# Patient Record
Sex: Male | Born: 1940 | Race: White | Hispanic: No | Smoking: Never smoker
Health system: Southern US, Community
[De-identification: ages and names within clinical notes are randomized; demographics above are authoritative.]

## PROBLEM LIST (undated history)

## (undated) DIAGNOSIS — I10 Essential (primary) hypertension: Secondary | ICD-10-CM

## (undated) DIAGNOSIS — E119 Type 2 diabetes mellitus without complications: Secondary | ICD-10-CM

## (undated) HISTORY — PX: NOSE SURGERY: SHX723

## (undated) HISTORY — PX: HERNIA REPAIR: SHX51

---

## 2009-03-30 ENCOUNTER — Inpatient Hospital Stay (HOSPITAL_COMMUNITY): Admission: RE | Admit: 2009-03-30 | Discharge: 2009-04-11 | Payer: Self-pay | Admitting: Psychiatry

## 2009-03-30 ENCOUNTER — Ambulatory Visit: Payer: Self-pay | Admitting: Psychiatry

## 2009-04-29 ENCOUNTER — Emergency Department (HOSPITAL_COMMUNITY): Admission: EM | Admit: 2009-04-29 | Discharge: 2009-05-01 | Payer: Self-pay | Admitting: Emergency Medicine

## 2009-04-29 ENCOUNTER — Ambulatory Visit (HOSPITAL_COMMUNITY): Admission: RE | Admit: 2009-04-29 | Discharge: 2009-04-29 | Payer: Self-pay | Admitting: Psychiatry

## 2010-08-11 IMAGING — CR DG CHEST 2V
2 series · 2 of 2 positions shown · non-contrast
Comparison: None

CLINICAL DATA: Hypertension, diabetic.  Medical clearance.

CHEST - 2 VIEW

[w chest pa]
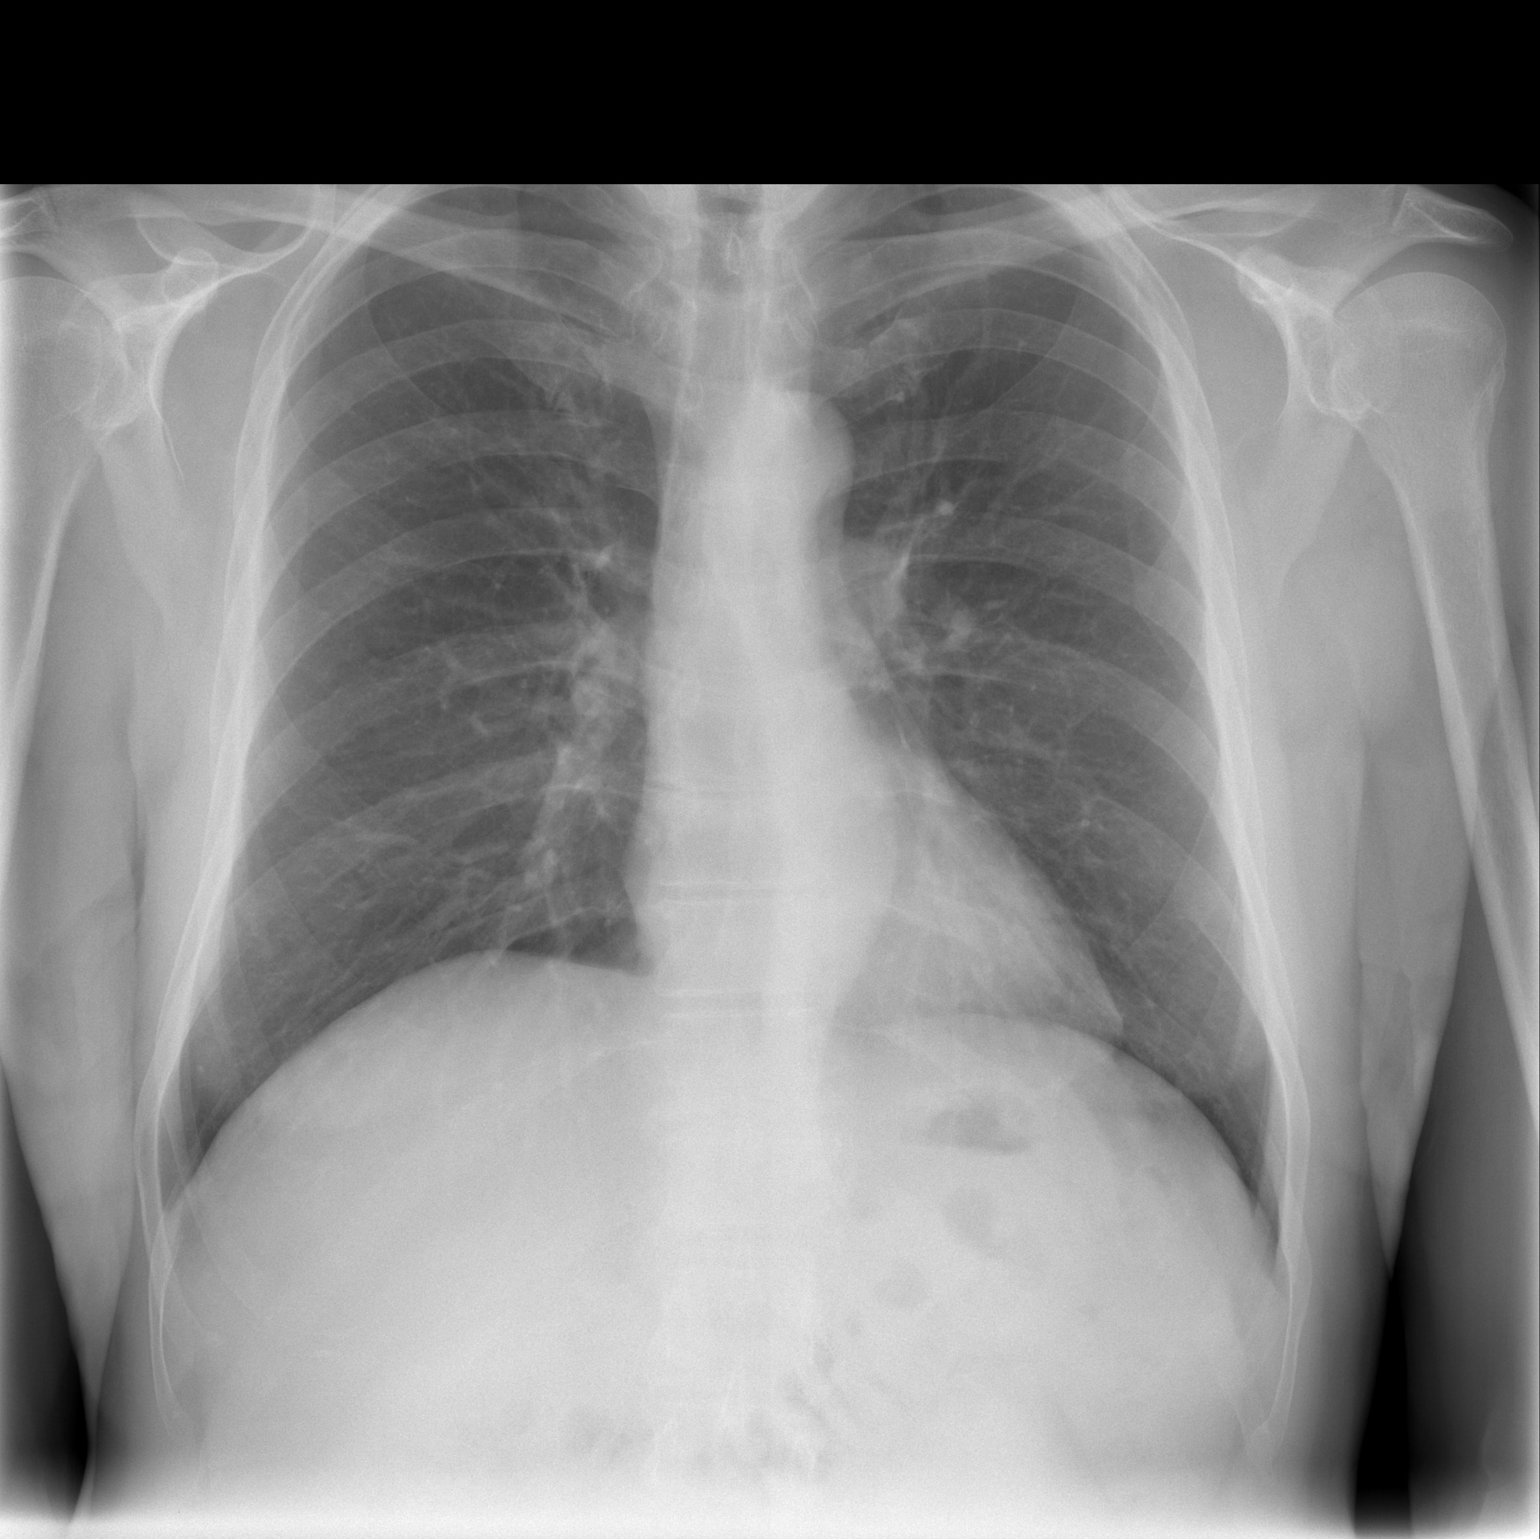

[w chest lat]
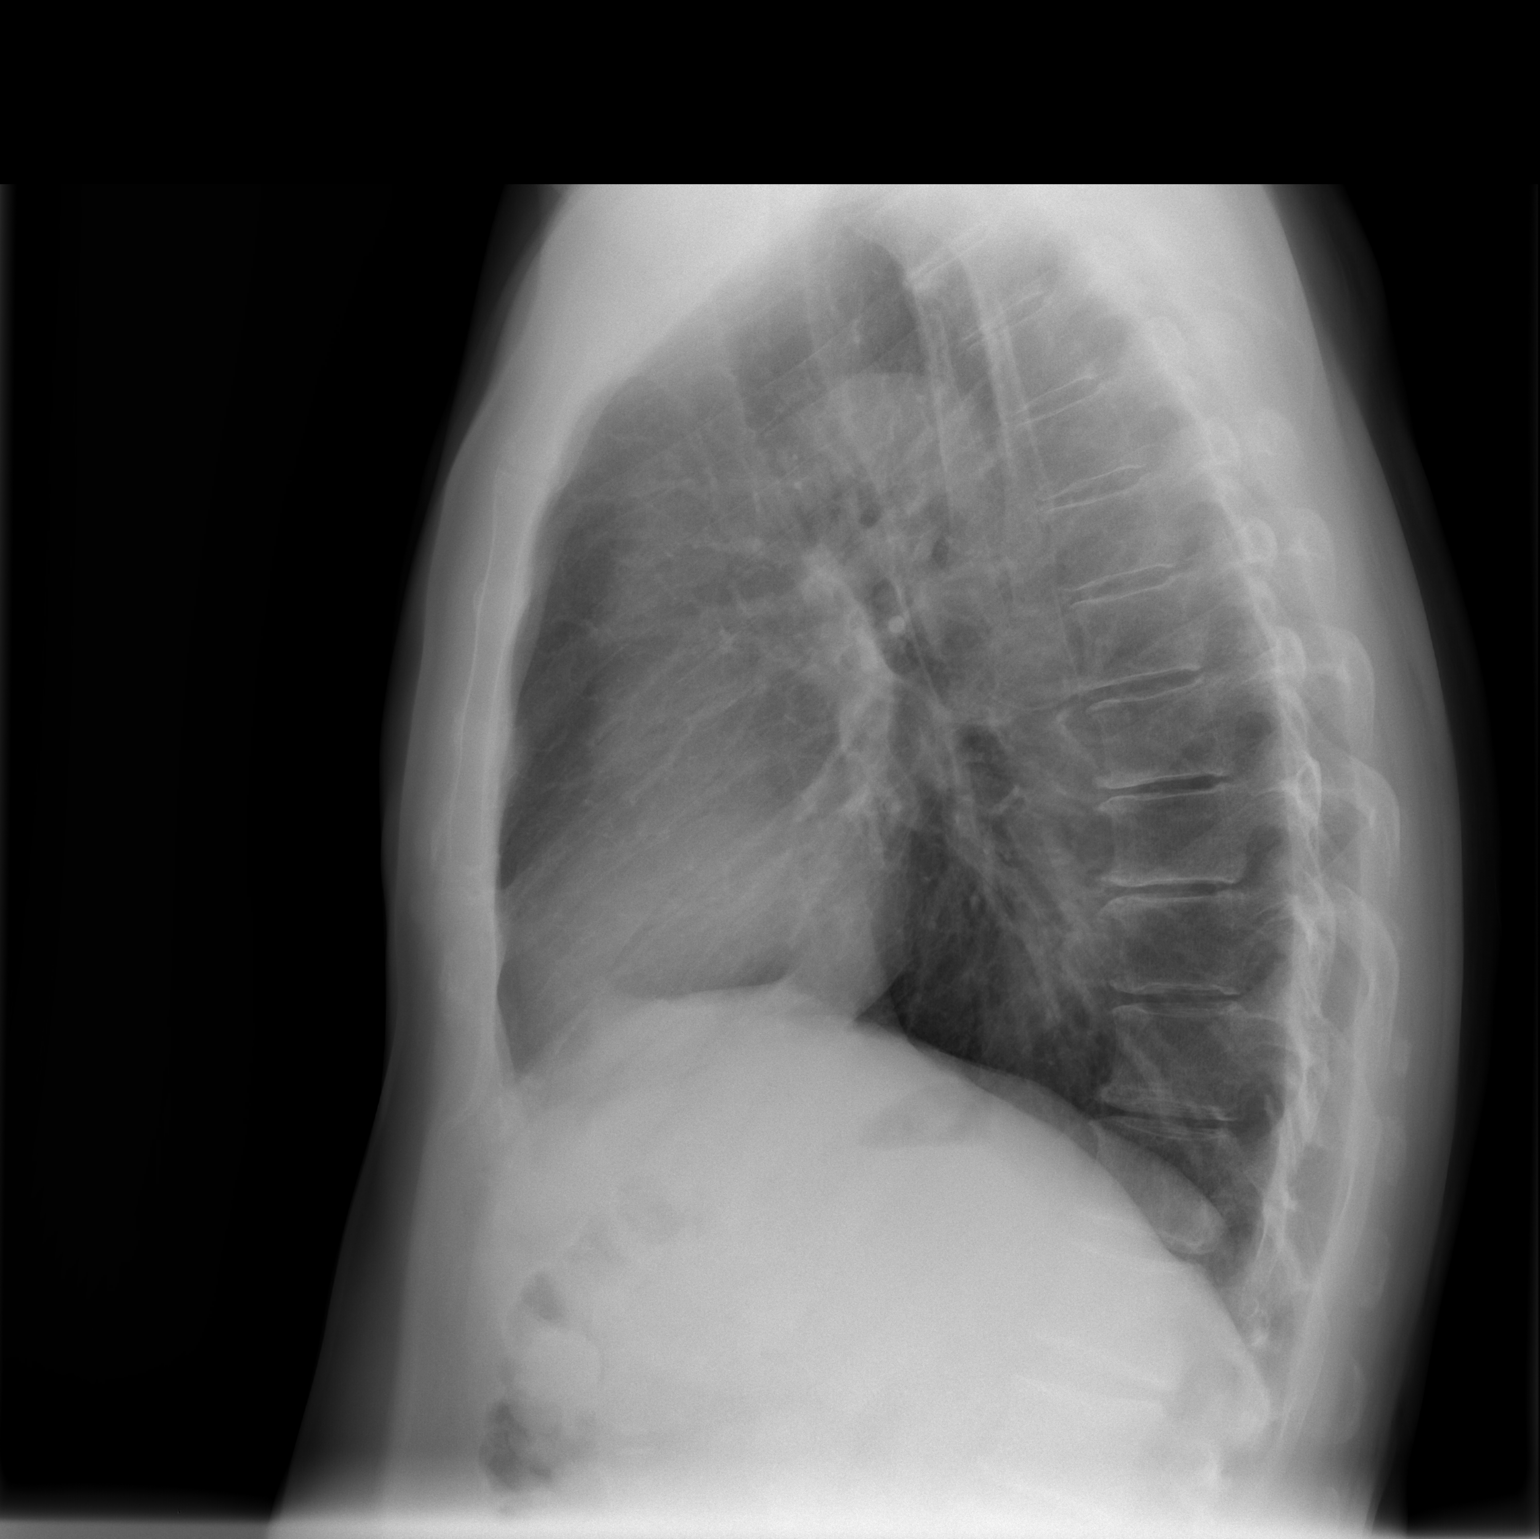

[2 of 2 positions shown; findings below may reference images not displayed]

FINDINGS: The cardiomediastinal silhouette is unremarkable.
The lungs are clear.
There is no evidence of focal airspace disease, pulmonary edema,
pleural effusion, or pneumothorax.
No acute bony abnormalities are identified.
IMPRESSION: No evidence of acute cardiopulmonary disease.

## 2010-08-20 LAB — URINALYSIS, ROUTINE W REFLEX MICROSCOPIC
Glucose, UA: NEGATIVE mg/dL
Ketones, ur: NEGATIVE mg/dL
pH: 5.5 (ref 5.0–8.0)

## 2010-08-20 LAB — BASIC METABOLIC PANEL
BUN: 11 mg/dL (ref 6–23)
CO2: 27 mEq/L (ref 19–32)
Chloride: 102 mEq/L (ref 96–112)
Glucose, Bld: 100 mg/dL — ABNORMAL HIGH (ref 70–99)
Potassium: 4.2 mEq/L (ref 3.5–5.1)

## 2010-08-20 LAB — RAPID URINE DRUG SCREEN, HOSP PERFORMED
Cocaine: NOT DETECTED
Opiates: NOT DETECTED

## 2010-08-20 LAB — CBC
HCT: 47.2 % (ref 39.0–52.0)
Hemoglobin: 15.9 g/dL (ref 13.0–17.0)
MCHC: 33.7 g/dL (ref 30.0–36.0)
MCV: 92.4 fL (ref 78.0–100.0)
RDW: 12.9 % (ref 11.5–15.5)

## 2010-08-20 LAB — DIFFERENTIAL
Basophils Absolute: 0 10*3/uL (ref 0.0–0.1)
Eosinophils Relative: 1 % (ref 0–5)
Lymphocytes Relative: 25 % (ref 12–46)
Monocytes Absolute: 0.4 10*3/uL (ref 0.1–1.0)

## 2010-08-20 LAB — HEPATIC FUNCTION PANEL
ALT: 22 U/L (ref 0–53)
AST: 18 U/L (ref 0–37)
Bilirubin, Direct: 0.1 mg/dL (ref 0.0–0.3)
Total Protein: 6.9 g/dL (ref 6.0–8.3)

## 2010-08-21 LAB — GLUCOSE, CAPILLARY
Glucose-Capillary: 109 mg/dL — ABNORMAL HIGH (ref 70–99)
Glucose-Capillary: 114 mg/dL — ABNORMAL HIGH (ref 70–99)
Glucose-Capillary: 135 mg/dL — ABNORMAL HIGH (ref 70–99)
Glucose-Capillary: 135 mg/dL — ABNORMAL HIGH (ref 70–99)
Glucose-Capillary: 166 mg/dL — ABNORMAL HIGH (ref 70–99)
Glucose-Capillary: 202 mg/dL — ABNORMAL HIGH (ref 70–99)
Glucose-Capillary: 242 mg/dL — ABNORMAL HIGH (ref 70–99)
Glucose-Capillary: 260 mg/dL — ABNORMAL HIGH (ref 70–99)
Glucose-Capillary: 261 mg/dL — ABNORMAL HIGH (ref 70–99)
Glucose-Capillary: 134 mg/dL — ABNORMAL HIGH (ref 70–99)
Glucose-Capillary: 148 mg/dL — ABNORMAL HIGH (ref 70–99)
Glucose-Capillary: 188 mg/dL — ABNORMAL HIGH (ref 70–99)
Glucose-Capillary: 88 mg/dL (ref 70–99)
Glucose-Capillary: 99 mg/dL (ref 70–99)

## 2010-08-21 LAB — CBC
HCT: 45.1 % (ref 39.0–52.0)
Hemoglobin: 15.5 g/dL (ref 13.0–17.0)
MCV: 91.3 fL (ref 78.0–100.0)
Platelets: 244 10*3/uL (ref 150–400)
WBC: 4.6 10*3/uL (ref 4.0–10.5)

## 2010-08-21 LAB — COMPREHENSIVE METABOLIC PANEL
Albumin: 4 g/dL (ref 3.5–5.2)
Alkaline Phosphatase: 50 U/L (ref 39–117)
BUN: 13 mg/dL (ref 6–23)
CO2: 28 mEq/L (ref 19–32)
Chloride: 99 mEq/L (ref 96–112)
Creatinine, Ser: 1 mg/dL (ref 0.4–1.5)
GFR calc non Af Amer: >60 mL/min (ref >60)
Glucose, Bld: 194 mg/dL — ABNORMAL HIGH (ref 70–99)
Potassium: 3.7 mEq/L (ref 3.5–5.1)
Total Bilirubin: 1 mg/dL (ref 0.3–1.2)

## 2015-05-12 ENCOUNTER — Encounter (HOSPITAL_BASED_OUTPATIENT_CLINIC_OR_DEPARTMENT_OTHER): Payer: Self-pay | Admitting: Emergency Medicine

## 2015-05-12 ENCOUNTER — Emergency Department (HOSPITAL_BASED_OUTPATIENT_CLINIC_OR_DEPARTMENT_OTHER)
Admission: EM | Admit: 2015-05-12 | Discharge: 2015-05-13 | Disposition: A | Discharge status: Home / Self Care | Payer: Medicare HMO | Attending: Emergency Medicine | Admitting: Emergency Medicine

## 2015-05-12 DIAGNOSIS — I10 Essential (primary) hypertension: Secondary | ICD-10-CM | POA: Insufficient documentation

## 2015-05-12 DIAGNOSIS — R234 Changes in skin texture: Secondary | ICD-10-CM

## 2015-05-12 DIAGNOSIS — L988 Other specified disorders of the skin and subcutaneous tissue: Secondary | ICD-10-CM | POA: Insufficient documentation

## 2015-05-12 DIAGNOSIS — E119 Type 2 diabetes mellitus without complications: Secondary | ICD-10-CM | POA: Insufficient documentation

## 2015-05-12 DIAGNOSIS — Z7984 Long term (current) use of oral hypoglycemic drugs: Secondary | ICD-10-CM | POA: Insufficient documentation

## 2015-05-12 DIAGNOSIS — Z79899 Other long term (current) drug therapy: Secondary | ICD-10-CM | POA: Diagnosis not present

## 2015-05-12 HISTORY — DX: Essential (primary) hypertension: I10

## 2015-05-12 HISTORY — DX: Type 2 diabetes mellitus without complications: E11.9

## 2015-05-12 NOTE — ED Provider Notes (Signed)
CSN: [id number]     Arrival date & time 05/12/15  2134 History  By signing my name below, I, Elon SpannerGarrett Cook, attest that this documentation has been prepared under the direction and in the presence of Shon Batonourtney F Maronda Caison, MD. Electronically Signed: Elon SpannerGarrett Cook, ED Scribe. 05/12/2015. 11:52 PM.   Chief Complaint  Patient presents with  . Hand Problem    The history is provided by the patient. No language interpreter was used.   HPI Comments: Catha GosselinDarrell Corbitt is a 74 y.o. right-handed male who presents to the Emergency Department complaining of an improving, non-draining wound on knuckles of the left hand onset 4 days ago that began as dry skin.  The patient reports concern of infection prompted him to come to the ED today.  He reports mild stinging of the wound at night.  He notes some grey tinge to the surrounding skin by has not noticed any redness.   Denies drainage or fever.  Past Medical History  Diagnosis Date  . Hypertension   . Diabetes mellitus without complication [facility](HCC)    Past Surgical History  Procedure Laterality Date  . Hernia repair    . Nose surgery     History reviewed. No pertinent family history. Social History  Substance Use Topics  . Smoking status: Never Smoker   . Smokeless tobacco: None  . Alcohol Use: No    Review of Systems  Constitutional: Negative for fever.  Skin: Positive for wound. Negative for color change.  All other systems reviewed and are negative.  A complete 10 system review of systems was obtained and all systems are negative except as noted in the HPI and PMH.   Allergies  Review of patient's allergies indicates no known allergies.  Home Medications   Prior to Admission medications   Medication Sig Start Date End Date Taking? Authorizing Provider  glipiZIDE (GLUCOTROL) 10 MG tablet Take 10 mg by mouth daily before breakfast.   Yes Historical Provider, MD  lisinopril (PRINIVIL,ZESTRIL) 10 MG tablet Take 10 mg by mouth daily.   Yes  Historical Provider, MD  cephALEXin (KEFLEX) 500 MG capsule Take 1 capsule (500 mg total) by mouth 4 (four) times daily. 05/13/15   Shon Batonourtney F Nima Bamburg, MD   BP 108/77 mmHg  Pulse 94  Temp(Src) 98.8 F (37.1 C) (Oral)  Resp 18  Ht 5\' 9"  (1.753 m)  Wt 139 lb 7 oz (63.248 kg)  BMI 20.58 kg/m2  SpO2 98% Physical Exam  Constitutional: He is oriented to person, place, and time. No distress.  Elderly  HENT:  Head: Normocephalic and atraumatic.  Cardiovascular: Normal rate and regular rhythm.   Pulmonary/Chest: Effort normal. No respiratory distress.  Musculoskeletal: He exhibits no edema or tenderness.  Normal range of motion of the MCP, PIP and DIP joints of the left hand, 2+ radial pulse  Neurological: He is alert and oriented to person, place, and time.  Skin: Skin is warm and dry.  There is a 1.5 cm crack over the third MCP joint the left hand, mild erythema just adjacent to the skin break, no significant erythema or streaking up the arm  Psychiatric: He has a normal mood and affect.  Nursing note and vitals reviewed.   ED Course  Procedures (including critical care time)  DIAGNOSTIC STUDIES: Oxygen Saturation is 99% on RA, normal by my interpretation.    COORDINATION OF CARE:  11:52 PM Patient advised to use moisturizer.  Discussed low concern for infection.  Return precautions advised.  Patient  acknowledges and agrees with plan.    Labs Review Labs Reviewed - No data to display  Imaging Review No results found. I have personally reviewed and evaluated these images and lab results as part of my medical decision-making.   EKG Interpretation None      MDM   Final diagnoses:  Cracked skin    Patient presents with cracked dry skin over the knuckles of the left hand with a significant break in the skin over the left third MCP joint. No obvious cellulitis; however, patient is certainly at risk for cellulitis. He is neurovascularly intact. Discussed with patient using  Aquaphor or another moisturizing ointment on his hands. Topical antibiotic ointment to the wound. We'll provide the patient with a short course of Keflex given that he is at risk for cellulitis and has mild erythema adjacent to the wound.  After history, exam, and medical workup I feel the patient has been appropriately medically screened and is safe for discharge home. Pertinent diagnoses were discussed with the patient. Patient was given return precautions.  I personally performed the services described in this documentation, which was scribed in my presence. The recorded information has been reviewed and is accurate.    Shon Baton, MD 05/13/15 [id number]

## 2015-05-12 NOTE — ED Notes (Signed)
Patient states that he has a dry spot open and crack on his hand about 4 days ago. The patient has an open area to his 2nd knuckle with swelling noted. Patient reports that it is warm to touch and red, this RN does not appreciate these finding.

## 2015-05-13 MED ORDER — CEPHALEXIN 500 MG PO CAPS: 500.0000 mg | ORAL_CAPSULE | Freq: Four times a day (QID) | ORAL | Status: DC

## 2015-05-13 NOTE — Discharge Instructions (Signed)
You were seen today for a crack in your skin.  THis puts you at risk for cellulitis.  Some mild redness around scratch, he will be given several days of antibiotics. See return precautions for cellulitis below.  Cellulitis Cellulitis is an infection of the skin and the tissue beneath it. The infected area is usually red and tender. Cellulitis occurs most often in the arms and lower legs.  CAUSES  Cellulitis is caused by bacteria that enter the skin through cracks or cuts in the skin. The most common types of bacteria that cause cellulitis are staphylococci and streptococci. SIGNS AND SYMPTOMS   Redness and warmth.  Swelling.  Tenderness or pain.  Fever. DIAGNOSIS  Your health care provider can usually determine what is wrong based on a physical exam. Blood tests may also be done. TREATMENT  Treatment usually involves taking an antibiotic medicine. HOME CARE INSTRUCTIONS   Take your antibiotic medicine as directed by your health care provider. Finish the antibiotic even if you start to feel better.  Keep the infected arm or leg elevated to reduce swelling.  Apply a warm cloth to the affected area up to 4 times per day to relieve pain.  Take medicines only as directed by your health care provider.  Keep all follow-up visits as directed by your health care provider. SEEK MEDICAL CARE IF:   You notice red streaks coming from the infected area.  Your red area gets larger or turns dark in color.  Your bone or joint underneath the infected area becomes painful after the skin has healed.  Your infection returns in the same area or another area.  You notice a swollen bump in the infected area.  You develop new symptoms.  You have a fever. SEEK IMMEDIATE MEDICAL CARE IF:   You feel very sleepy.  You develop vomiting or diarrhea.  You have a general ill feeling (malaise) with muscle aches and pains.   This information is not intended to replace advice given to you by your  health care provider. Make sure you discuss any questions you have with your health care provider.   Document Released: 02/12/2005 Document Revised: 01/24/2015 Document Reviewed: 07/21/2011 Elsevier Interactive Patient Education [employer]2016 [employer]vier Inc.

## 2015-09-18 ENCOUNTER — Emergency Department (HOSPITAL_COMMUNITY): Payer: Medicare HMO

## 2015-09-18 ENCOUNTER — Inpatient Hospital Stay (HOSPITAL_COMMUNITY): Payer: Medicare HMO

## 2015-09-18 ENCOUNTER — Encounter (HOSPITAL_COMMUNITY): Payer: Self-pay | Admitting: *Deleted

## 2015-09-18 ENCOUNTER — Inpatient Hospital Stay (HOSPITAL_COMMUNITY)
Admission: EM | Admit: 2015-09-18 | Discharge: 2015-09-19 | DRG: 065 | Disposition: A | Discharge status: Home Health | Payer: Medicare HMO | Attending: Internal Medicine | Admitting: Internal Medicine

## 2015-09-18 DIAGNOSIS — E119 Type 2 diabetes mellitus without complications: Secondary | ICD-10-CM | POA: Diagnosis not present

## 2015-09-18 DIAGNOSIS — Z823 Family history of stroke: Secondary | ICD-10-CM | POA: Diagnosis not present

## 2015-09-18 DIAGNOSIS — R270 Ataxia, unspecified: Secondary | ICD-10-CM | POA: Diagnosis present

## 2015-09-18 DIAGNOSIS — I10 Essential (primary) hypertension: Secondary | ICD-10-CM | POA: Diagnosis present

## 2015-09-18 DIAGNOSIS — E785 Hyperlipidemia, unspecified: Secondary | ICD-10-CM | POA: Diagnosis present

## 2015-09-18 DIAGNOSIS — Z7902 Long term (current) use of antithrombotics/antiplatelets: Secondary | ICD-10-CM | POA: Diagnosis not present

## 2015-09-18 DIAGNOSIS — R200 Anesthesia of skin: Secondary | ICD-10-CM

## 2015-09-18 DIAGNOSIS — I6789 Other cerebrovascular disease: Secondary | ICD-10-CM | POA: Diagnosis present

## 2015-09-18 DIAGNOSIS — Z7984 Long term (current) use of oral hypoglycemic drugs: Secondary | ICD-10-CM

## 2015-09-18 DIAGNOSIS — I639 Cerebral infarction, unspecified: Secondary | ICD-10-CM

## 2015-09-18 DIAGNOSIS — I672 Cerebral atherosclerosis: Secondary | ICD-10-CM | POA: Diagnosis present

## 2015-09-18 DIAGNOSIS — I161 Hypertensive emergency: Secondary | ICD-10-CM | POA: Diagnosis present

## 2015-09-18 DIAGNOSIS — R29701 NIHSS score 1: Secondary | ICD-10-CM | POA: Diagnosis present

## 2015-09-18 DIAGNOSIS — I638 Other cerebral infarction: Secondary | ICD-10-CM | POA: Diagnosis not present

## 2015-09-18 DIAGNOSIS — Z7982 Long term (current) use of aspirin: Secondary | ICD-10-CM | POA: Diagnosis not present

## 2015-09-18 DIAGNOSIS — R000 Tachycardia, unspecified: Secondary | ICD-10-CM | POA: Diagnosis present

## 2015-09-18 DIAGNOSIS — R202 Paresthesia of skin: Secondary | ICD-10-CM | POA: Diagnosis present

## 2015-09-18 DIAGNOSIS — E1165 Type 2 diabetes mellitus with hyperglycemia: Secondary | ICD-10-CM | POA: Diagnosis present

## 2015-09-18 LAB — DIFFERENTIAL
Basophils Absolute: 0 10*3/uL (ref 0.0–0.1)
Basophils Relative: 0 %
Eosinophils Absolute: 0.1 10*3/uL (ref 0.0–0.7)
Eosinophils Relative: 2 %
LYMPHS PCT: 19 %
Lymphs Abs: 1.2 10*3/uL (ref 0.7–4.0)
MONO ABS: 0.6 10*3/uL (ref 0.1–1.0)
Monocytes Relative: 9 %
Neutro Abs: 4.5 10*3/uL (ref 1.7–7.7)
Neutrophils Relative %: 70 %

## 2015-09-18 LAB — URINALYSIS, ROUTINE W REFLEX MICROSCOPIC
Bilirubin Urine: NEGATIVE
Glucose, UA: NEGATIVE mg/dL
Hgb urine dipstick: NEGATIVE
Ketones, ur: NEGATIVE mg/dL
LEUKOCYTES UA: NEGATIVE
NITRITE: NEGATIVE
PH: 6.5 (ref 5.0–8.0)
Protein, ur: 100 mg/dL — AB
SPECIFIC GRAVITY, URINE: 1.008 (ref 1.005–1.030)

## 2015-09-18 LAB — COMPREHENSIVE METABOLIC PANEL
ALK PHOS: 62 U/L (ref 38–126)
ALT: 15 U/L — ABNORMAL LOW (ref 17–63)
ANION GAP: 11 (ref 5–15)
AST: 18 U/L (ref 15–41)
Albumin: 3.9 g/dL (ref 3.5–5.0)
BUN: 15 mg/dL (ref 6–20)
CALCIUM: 9.6 mg/dL (ref 8.9–10.3)
CO2: 25 mmol/L (ref 22–32)
Chloride: 102 mmol/L (ref 101–111)
Creatinine, Ser: 1.06 mg/dL (ref 0.61–1.24)
GFR calc non Af Amer: >60 mL/min (ref >60)
Glucose, Bld: 174 mg/dL — ABNORMAL HIGH (ref 65–99)
Potassium: 3.9 mmol/L (ref 3.5–5.1)
SODIUM: 138 mmol/L (ref 135–145)
TOTAL PROTEIN: 6.7 g/dL (ref 6.5–8.1)
Total Bilirubin: 0.5 mg/dL (ref 0.3–1.2)

## 2015-09-18 LAB — I-STAT CHEM 8, ED
BUN: 17 mg/dL (ref 6–20)
CALCIUM ION: 1.09 mmol/L — AB (ref 1.13–1.30)
CREATININE: 1 mg/dL (ref 0.61–1.24)
Chloride: 100 mmol/L — ABNORMAL LOW (ref 101–111)
GLUCOSE: 175 mg/dL — AB (ref 65–99)
HEMATOCRIT: 43 % (ref 39.0–52.0)
HEMOGLOBIN: 14.6 g/dL (ref 13.0–17.0)
Potassium: 3.8 mmol/L (ref 3.5–5.1)
Sodium: 139 mmol/L (ref 135–145)
TCO2: 25 mmol/L (ref 0–100)

## 2015-09-18 LAB — CBC
HCT: 41.1 % (ref 39.0–52.0)
Hemoglobin: 13.8 g/dL (ref 13.0–17.0)
MCH: 30.1 pg (ref 26.0–34.0)
MCHC: 33.6 g/dL (ref 30.0–36.0)
MCV: 89.5 fL (ref 78.0–100.0)
PLATELETS: 247 10*3/uL (ref 150–400)
RBC: 4.59 MIL/uL (ref 4.22–5.81)
RDW: 13 % (ref 11.5–15.5)
WBC: 6.5 10*3/uL (ref 4.0–10.5)

## 2015-09-18 LAB — ECHOCARDIOGRAM COMPLETE
HEIGHTINCHES: 69 in
WEIGHTICAEL: 2522.06 [oz_av]

## 2015-09-18 LAB — URINE MICROSCOPIC-ADD ON: Squamous Epithelial / HPF: NONE SEEN

## 2015-09-18 LAB — LIPID PANEL
CHOLESTEROL: 222 mg/dL — AB (ref 0–200)
HDL: 36 mg/dL — ABNORMAL LOW (ref >40)
LDL Cholesterol: 130 mg/dL — ABNORMAL HIGH (ref 0–99)
Total CHOL/HDL Ratio: 6.2 RATIO
Triglycerides: 279 mg/dL — ABNORMAL HIGH (ref <150)
VLDL: 56 mg/dL — ABNORMAL HIGH (ref 0–40)

## 2015-09-18 LAB — CBG MONITORING, ED: Glucose-Capillary: 163 mg/dL — ABNORMAL HIGH (ref 65–99)

## 2015-09-18 LAB — APTT: aPTT: 30 seconds (ref 24–37)

## 2015-09-18 LAB — GLUCOSE, CAPILLARY
GLUCOSE-CAPILLARY: 137 mg/dL — AB (ref 65–99)
GLUCOSE-CAPILLARY: 139 mg/dL — AB (ref 65–99)
Glucose-Capillary: 164 mg/dL — ABNORMAL HIGH (ref 65–99)
Glucose-Capillary: 193 mg/dL — ABNORMAL HIGH (ref 65–99)

## 2015-09-18 LAB — PROTIME-INR
INR: 1.07 (ref 0.00–1.49)
PROTHROMBIN TIME: 14.1 s (ref 11.6–15.2)

## 2015-09-18 LAB — RAPID URINE DRUG SCREEN, HOSP PERFORMED
AMPHETAMINES: NOT DETECTED
Barbiturates: NOT DETECTED
Benzodiazepines: NOT DETECTED
Cocaine: NOT DETECTED
OPIATES: NOT DETECTED
Tetrahydrocannabinol: NOT DETECTED

## 2015-09-18 LAB — I-STAT TROPONIN, ED: Troponin i, poc: 0 ng/mL (ref 0.00–0.08)

## 2015-09-18 LAB — ETHANOL

## 2015-09-18 MED ORDER — CLOPIDOGREL BISULFATE 75 MG PO TABS
75.0000 mg | ORAL_TABLET | Freq: Every day | ORAL | Status: DC
  Administered 2015-09-18 – 2015-09-19 (×2): 75 mg via ORAL
  Filled 2015-09-18 (×2): qty 1

## 2015-09-18 MED ORDER — ASPIRIN 325 MG PO TABS
325.0000 mg | ORAL_TABLET | Freq: Every day | ORAL | Status: DC
  Administered 2015-09-18: 325 mg via ORAL
  Filled 2015-09-18: qty 1

## 2015-09-18 MED ORDER — ACETAMINOPHEN 500 MG PO TABS
1000.0000 mg | ORAL_TABLET | Freq: Four times a day (QID) | ORAL | Status: DC | PRN
  Administered 2015-09-18 – 2015-09-19 (×4): 1000 mg via ORAL
  Filled 2015-09-18 (×4): qty 2

## 2015-09-18 MED ORDER — INSULIN ASPART 100 UNIT/ML ~~LOC~~ SOLN: 0.0000 [IU] | SUBCUTANEOUS | Status: DC

## 2015-09-18 MED ORDER — STROKE: EARLY STAGES OF RECOVERY BOOK
Freq: Once | Status: AC
  Administered 2015-09-18: 04:00:00
  Filled 2015-09-18: qty 1

## 2015-09-18 MED ORDER — INSULIN ASPART 100 UNIT/ML ~~LOC~~ SOLN
0.0000 [IU] | Freq: Three times a day (TID) | SUBCUTANEOUS | Status: DC
  Administered 2015-09-18: 2 [IU] via SUBCUTANEOUS
  Administered 2015-09-18 – 2015-09-19 (×3): 1 [IU] via SUBCUTANEOUS
  Administered 2015-09-19 (×2): 2 [IU] via SUBCUTANEOUS

## 2015-09-18 MED ORDER — ENOXAPARIN SODIUM 40 MG/0.4ML ~~LOC~~ SOLN
40.0000 mg | Freq: Every day | SUBCUTANEOUS | Status: DC
  Administered 2015-09-18 – 2015-09-19 (×2): 40 mg via SUBCUTANEOUS
  Filled 2015-09-18 (×2): qty 0.4

## 2015-09-18 MED ORDER — ATORVASTATIN CALCIUM 40 MG PO TABS
40.0000 mg | ORAL_TABLET | Freq: Every day | ORAL | Status: DC
  Administered 2015-09-18 – 2015-09-19 (×2): 40 mg via ORAL
  Filled 2015-09-18 (×2): qty 1

## 2015-09-18 MED ORDER — SENNOSIDES-DOCUSATE SODIUM 8.6-50 MG PO TABS: 1.0000 | ORAL_TABLET | Freq: Every evening | ORAL | Status: DC | PRN

## 2015-09-18 MED ORDER — PANTOPRAZOLE SODIUM 40 MG PO TBEC
80.0000 mg | DELAYED_RELEASE_TABLET | Freq: Every day | ORAL | Status: DC
  Administered 2015-09-18 – 2015-09-19 (×2): 80 mg via ORAL
  Filled 2015-09-18 (×2): qty 2

## 2015-09-18 NOTE — H&P (Signed)
History and Physical    [name] UJW:[id number] DOB: 04-19-1941 DOA: 09/18/2015  Referring MD/NP/PA: Dr. Clarene Duke PCP: No primary care provider on file. Outpatient Specialists: None Patient coming from: Home  Chief Complaint: R sided weakness  HPI: Kenneth Arnold is a 75 y.o. male with medical history significant of DM2, HTN.  Patient presents to the ED with c/o sudden onset of R sided numbness and weakness.  Symptoms onset earlier this evening at about 2345.  Symptoms have been improving since onset and are nearly resolved at this point but not completely back to baseline.  ED Course: Patient initially seen as a code stroke in ED.  TPA offered but not recommended due to rapidly improving deficits which are nearly resolved by the time I am seeing him in the ED.  Review of Systems: As per HPI otherwise 10 point review of systems negative.    Past Medical History  Diagnosis Date  . Hypertension   . Diabetes mellitus without complication [facility])     Past Surgical History  Procedure Laterality Date  . Hernia repair    . Nose surgery       reports that he has never smoked. He does not have any smokeless tobacco history on file. He reports that he does not drink alcohol or use illicit drugs.  No Known Allergies  No family history on file.   Prior to Admission medications   Medication Sig Start Date End Date Taking? Authorizing Provider  acetaminophen (TYLENOL) 500 MG tablet Take 1,000 mg by mouth every 6 (six) hours as needed for headache.   Yes Historical Provider, MD  aspirin EC 81 MG tablet Take 81 mg by mouth daily.   Yes Historical Provider, MD  esomeprazole (NEXIUM) 40 MG capsule Take 40 mg by mouth daily at 12 noon.   Yes Historical Provider, MD  glimepiride (AMARYL) 4 MG tablet Take 4 mg by mouth daily with breakfast.   Yes Historical Provider, MD  lisinopril (PRINIVIL,ZESTRIL) 10 MG tablet Take 10 mg by mouth daily.   Yes Historical Provider, MD    Physical  Exam: Filed Vitals:   09/18/15 0230 09/18/15 0245 09/18/15 0300 09/18/15 0315  BP: 140/105 152/106 163/111 159/105  Pulse: 98 100 101 92  Temp:      TempSrc:      Resp: 18 33 19 12  Height:      Weight:      SpO2: 94% 95% 95% 95%      Constitutional: NAD, calm, comfortable Filed Vitals:   09/18/15 0230 09/18/15 0245 09/18/15 0300 09/18/15 0315  BP: 140/105 152/106 163/111 159/105  Pulse: 98 100 101 92  Temp:      TempSrc:      Resp: 18 33 19 12  Height:      Weight:      SpO2: 94% 95% 95% 95%   Eyes: PERRL, lids and conjunctivae normal ENMT: Mucous membranes are moist. Posterior pharynx clear of any exudate or lesions.Normal dentition.  Neck: normal, supple, no masses, no thyromegaly Respiratory: clear to auscultation bilaterally, no wheezing, no crackles. Normal respiratory effort. No accessory muscle use.  Cardiovascular: Regular rate and rhythm, no murmurs / rubs / gallops. No extremity edema. 2+ pedal pulses. No carotid bruits.  Abdomen: no tenderness, no masses palpated. No hepatosplenomegaly. Bowel sounds positive.  Musculoskeletal: no clubbing / cyanosis. No joint deformity upper and lower extremities. Good ROM, no contractures. Normal muscle tone.  Skin: no rashes, lesions, ulcers. No induration Neurologic: CN 2-12 grossly  intact. Sensation intact, DTR normal. Minimal 4+/5 weakness in RLE Psychiatric: Normal judgment and insight. Alert and oriented x 3. Normal mood.    Labs on Admission: I have personally reviewed following labs and imaging studies  CBC:  Recent Labs Lab 09/18/15 0111 09/18/15 0113  WBC 6.5  --   NEUTROABS 4.5  --   HGB 13.8 14.6  HCT 41.1 43.0  MCV 89.5  --   PLT 247  --    Basic Metabolic Panel:  Recent Labs Lab 09/18/15 0111 09/18/15 0113  NA 138 139  K 3.9 3.8  CL 102 100*  CO2 25  --   GLUCOSE 174* 175*  BUN 15 17  CREATININE 1.06 1.00  CALCIUM 9.6  --    GFR: Estimated Creatinine Clearance: 60.1 mL/min (by C-G  formula based on Cr of 1). Liver Function Tests:  Recent Labs Lab 09/18/15 0111  AST 18  ALT 15*  ALKPHOS 62  BILITOT 0.5  PROT 6.7  ALBUMIN 3.9   No results for input(s): LIPASE, AMYLASE in the last 168 hours. No results for input(s): AMMONIA in the last 168 hours. Coagulation Profile:  Recent Labs Lab 09/18/15 0111  INR 1.07   Cardiac Enzymes: No results for input(s): CKTOTAL, CKMB, CKMBINDEX, TROPONINI in the last 168 hours. BNP (last 3 results) No results for input(s): PROBNP in the last 8760 hours. HbA1C: No results for input(s): HGBA1C in the last 72 hours. CBG:  Recent Labs Lab 09/18/15 0117  GLUCAP 163*   Lipid Profile: No results for input(s): CHOL, HDL, LDLCALC, TRIG, CHOLHDL, LDLDIRECT in the last 72 hours. Thyroid Function Tests: No results for input(s): TSH, T4TOTAL, FREET4, T3FREE, THYROIDAB in the last 72 hours. Anemia Panel: No results for input(s): VITAMINB12, FOLATE, FERRITIN, TIBC, IRON, RETICCTPCT in the last 72 hours. Urine analysis:    Component Value Date/Time   COLORURINE STRAW* 09/18/2015 0144   APPEARANCEUR CLEAR 09/18/2015 0144   LABSPEC 1.008 09/18/2015 0144   PHURINE 6.5 09/18/2015 0144   GLUCOSEU NEGATIVE 09/18/2015 0144   HGBUR NEGATIVE 09/18/2015 0144   BILIRUBINUR NEGATIVE 09/18/2015 0144   KETONESUR NEGATIVE 09/18/2015 0144   PROTEINUR 100* 09/18/2015 0144   UROBILINOGEN 0.2 04/30/2009 1719   NITRITE NEGATIVE 09/18/2015 0144   LEUKOCYTESUR NEGATIVE 09/18/2015 0144   Sepsis Labs: @LABRCNTIP (procalcitonin:4,lacticidven:4) )No results found for this or any previous visit (from the past 240 hour(s)).   Radiological Exams on Admission: Ct Head Wo Contrast  09/18/2015  CLINICAL DATA:  Right-sided numbness. EXAM: CT HEAD WITHOUT CONTRAST TECHNIQUE: Contiguous axial images were obtained from the base of the skull through the vertex without intravenous contrast. COMPARISON:  01/10/2009 FINDINGS: There is no intracranial  hemorrhage, mass or evidence of acute infarction. There is mild generalized atrophy. There is remote lacunar infarction in the left frontal periventricular white matter. Gray-white differentiation is preserved. Posterior fossa is unremarkable. Visible portions of the paranasal sinuses and orbits are unremarkable. IMPRESSION: No acute intracranial findings. 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 These results were called by telephone at the time of interpretation on 09/18/2015 at 1:23 am to Dr. Frederick Peers , who verbally acknowledged these results. Electronically Signed   By: Ellery Plunk M.D.   On: 09/18/2015 01:23    EKG: Independently reviewed.  Assessment/Plan Principal Problem:   Ataxia Active Problems:   HTN (hypertension)   DM2 (diabetes mellitus, type 2) (HCC)   Ataxia - Likely acute ischemic stroke  Stroke pathway and  work up ordered  MRI pending to confirm  ASA 325 for now until seen by stroke team  Tele monitor  PT/OT/SLP  2d echo  Carotid duplex  A1C and lipid panel, did just have A1C drawn recently but he dosent remember what the result was.  HTN - Holding BP meds and allowing permissive HTN in setting of likely acute stroke  DM2 -  Holding PO hypoglycemics  Sensitive scale SSI AC/HS   DVT prophylaxis: Lovenox Code Status: Full Family Communication: Family at bedside Consults called: Neurology Admission status: Inpatient   Hillary BowGARDNER, [place] Diana M. DO [facility] Pager [phone] from 7PM-7AM  If 7AM-7PM, please contact the day physician for the patient [email or web address] Password [email or web address]  09/18/2015, 3:42 AM

## 2015-09-18 NOTE — Consult Note (Signed)
Neurology Consultation Reason for Consult: Numbness Referring Physician: Little, R  CC: Numbness in the right side  History is obtained from: Patient  HPI: Kenneth Arnold is a 75 y.o. male with a history of hypertension and diabetes who presents with right-sided numbness that started as he was doing deep breathing exercises. He states that he would take a deep breath and then hold it for a while. Following this, he had sudden onset right-sided numbness of the arm and leg. He states that after EMS arrived and he tried to stand up, he also had some difficulty with walking.  His NIH was 1, but after I stood him up and attempted to walk him, he did have significant difficulty with walking though he was able to ambulate. Given that he did have some difficulty with this, I did offer IV TPA. After discussion of the risks and benefits with the patient, it was decided against pursuing IV TPA.   Of note, he had transient right foot numbness recently.  LKW: 2345 tpa given?: no, mild symptoms   ROS: A 14 point ROS was performed and is negative except as noted in the HPI.   Past Medical History  Diagnosis Date  . Hypertension   . Diabetes mellitus without complication (HCC)      Family history: Father-stroke   Social History:  reports that he has never smoked. He does not have any smokeless tobacco history on file. He reports that he does not drink alcohol or use illicit drugs.   Exam: Current vital signs: BP 140/105 mmHg  Pulse 98  Temp(Src) 98.6 F (37 C) (Oral)  Resp 18  Ht 5\' 9"  (1.753 m)  Wt 67.586 kg (149 lb)  BMI 21.99 kg/m2  SpO2 94% Vital signs in last 24 hours: Temp:  [98.6 F (37 C)] 98.6 F (37 C) (05/02 0130) Pulse Rate:  [98-108] 98 (05/02 0230) Resp:  [14-22] 18 (05/02 0230) BP: (140-173)/(96-120) 140/105 mmHg (05/02 0230) SpO2:  [94 %-98 %] 94 % (05/02 0230) Weight:  [67.586 kg (149 lb)] 67.586 kg (149 lb) (05/02 0130)   Physical Exam  Constitutional:  Appears well-developed and well-nourished.  Psych: Affect appropriate to situation Eyes: No scleral injection HENT: No OP obstrucion Head: Normocephalic.  Cardiovascular: Normal rate and regular rhythm.  Respiratory: Effort normal and breath sounds normal to anterior ascultation GI: Soft.  No distension. There is no tenderness.  Skin: WDI  Neuro: Mental Status: Patient is awake, alert, oriented to person, place, month, year, and situation. Patient is able to give a clear and coherent history. No signs of aphasia or neglect Cranial Nerves: II: Visual Fields are full. Pupils are equal, round, and reactive to light.   III,IV, VI: EOMI without ptosis or diploplia.  V: Facial sensation is symmetric to temperature VII: Facial movement is symmetric.  VIII: hearing is intact to voice X: Uvula elevates symmetrically XI: Shoulder shrug is symmetric. XII: tongue is midline without atrophy or fasciculations.  Motor: Tone is normal. Bulk is normal. 5/5 strength was present in bilateral arms and the left leg. He does appear to have mild weakness 4+/5 to the right leg though he does not drift Sensory: Sensation is diminished in the right arm and leg Cerebellar: No ataxia on finger-nose-finger or heel-knee-shin   I have reviewed labs in epic and the results pertinent to this consultation are: CMP-unremarkable  I have reviewed the images obtained:  CT head-unremarkable  Impression: 75 year old male who presents with right-sided numbness. The correlation with deep  breathing exercises is interesting. Possibilities would include paradoxical emboli because of Valsalva while holding his breath. Another possibility would be vasoconstriction due to hyperventilation with subsequent infarction of an at risk area, which could be supported by his recent episode of right foot numbness. Also possible is that the correlation is unrelated.  Recommendations: 1. HgbA1c, fasting lipid panel 2. MRI, MRA  of  the brain without contrast 3. Frequent neuro checks 4. Echocardiogram 5. Carotid dopplers 6. Prophylactic therapy-Antiplatelet med: Aspirin - dose  PO or  PR 7. Risk factor modification 8. Telemetry monitoring 9. PT consult, OT consult, Speech consult 10. please page stroke NP  Or  PA  Or MD  M-F from 8am -4 pm starting 5/2 as this patient will be followed by the stroke team at this point.   You can look them up on [email or web address]      Ritta Slot, MD [facility] [phone]  If 7pm- 7am, please page neurology on call as listed in AMION.

## 2015-09-18 NOTE — ED Provider Notes (Signed)
CSN: [id number]     Arrival date & time 09/18/15  0103 History  By signing my name below, I, Hansel Feinstein, attest that this documentation has been prepared under the direction and in the presence of Sharlett Iles, MD. Electronically Signed: Hansel Feinstein, ED Scribe. 09/18/2015. 1:30 AM.    Chief Complaint  Patient presents with  . Code Stroke   The history is provided by the patient. No language interpreter was used.   HPI Comments: Kenneth Arnold is a 75 y.o. male with h/o HTN, DM who presents to the Emergency Department complaining of sudden onset RUE and RLE numbness onset tonight with associated and RLE weakness. Last seen normal at 2345. Pt also reports difficulty ambulating with his current symptoms secondary to weakness. Per pt, his gait is steady at baseline. He has not taken ASA PTA. No h/o of similar symptoms. Pt denies recent falls or head trauma. Pt is a non-smoker. FHx of stroke from father. Denies CP, SOB, HA and visual disturbance. He also denies recent fever, chills, emesis, nausea, abdominal pain, congestion and cough.   Past Medical History  Diagnosis Date  . Hypertension   . Diabetes mellitus without complication [facility])    Past Surgical History  Procedure Laterality Date  . Hernia repair    . Nose surgery     No family history on file. Social History  Substance Use Topics  . Smoking status: Never Smoker   . Smokeless tobacco: None  . Alcohol Use: No    Review of Systems A complete 10 system review of systems was obtained and all systems are negative except as noted in the HPI and PMH.    Allergies  Review of patient's allergies indicates no known allergies.  Home Medications   Prior to Admission medications   Medication Sig Start Date End Date Taking? Authorizing Provider  cephALEXin (KEFLEX) 500 MG capsule Take 1 capsule (500 mg total) by mouth 4 (four) times daily. 05/13/15   Merryl Hacker, MD  glipiZIDE (GLUCOTROL) 10 MG tablet Take 10 mg by mouth  daily before breakfast.    Historical Provider, MD  lisinopril (PRINIVIL,ZESTRIL) 10 MG tablet Take 10 mg by mouth daily.    Historical Provider, MD   BP 172/109 mmHg  Pulse 106  Resp 18  SpO2 98% Physical Exam  Constitutional: He is oriented to person, place, and time. He appears well-developed and well-nourished. No distress.  Awake, alert  HENT:  Head: Normocephalic and atraumatic.  Eyes: Conjunctivae and EOM are normal. Pupils are equal, round, and reactive to light.  Neck: Neck supple.  Cardiovascular: Normal rate, regular rhythm and normal heart sounds.   Mid-systolic click  Pulmonary/Chest: Effort normal and breath sounds normal. No respiratory distress.  Abdominal: Soft. Bowel sounds are normal. He exhibits no distension.  Musculoskeletal: He exhibits no edema.  Neurological: He is alert and oriented to person, place, and time. He has normal reflexes. No cranial nerve deficit. He exhibits normal muscle tone.  Fluent speech, normal finger-to-nose testing, negative pronator drift, no clonus 5/5 strength x all 4 extremities Normal sensation LUE/LLE, decreased fine touch sensation RUE/RLE Abnormal gait-- ataxia with shuffling gait  Skin: Skin is warm and dry.  Psychiatric: He has a normal mood and affect. Judgment and thought content normal.  Nursing note and vitals reviewed.   ED Course  Procedures (including critical care time) DIAGNOSTIC STUDIES: Oxygen Saturation is 98% on RA, normal by my interpretation.    COORDINATION OF CARE: 1:23 AM Discussed  treatment plan with pt at bedside which includes lab work, CT head, EKG, hospital admission and pt agreed to plan.   Labs Review Labs Reviewed  COMPREHENSIVE METABOLIC PANEL - Abnormal; Notable for the following:    Glucose, Bld 174 (*)    ALT 15 (*)    All other components within normal limits  URINALYSIS, ROUTINE W REFLEX MICROSCOPIC (NOT AT [facility]) - Abnormal; Notable for the following:    Color, Urine STRAW (*)     Protein, ur 100 (*)    All other components within normal limits  URINE MICROSCOPIC-ADD ON - Abnormal; Notable for the following:    Bacteria, UA RARE (*)    All other components within normal limits  LIPID PANEL - Abnormal; Notable for the following:    Cholesterol 222 (*)    Triglycerides 279 (*)    HDL 36 (*)    VLDL 56 (*)    LDL Cholesterol 130 (*)    All other components within normal limits  GLUCOSE, CAPILLARY - Abnormal; Notable for the following:    Glucose-Capillary 137 (*)    All other components within normal limits  I-STAT CHEM 8, ED - Abnormal; Notable for the following:    Chloride 100 (*)    Glucose, Bld 175 (*)    Calcium, Ion 1.09 (*)    All other components within normal limits  CBG MONITORING, ED - Abnormal; Notable for the following:    Glucose-Capillary 163 (*)    All other components within normal limits  ETHANOL  PROTIME-INR  APTT  CBC  DIFFERENTIAL  URINE RAPID DRUG SCREEN, HOSP PERFORMED  HEMOGLOBIN A1C  I-STAT TROPOININ, ED    Imaging Review Ct Head Wo Contrast  09/18/2015  CLINICAL DATA:  Right-sided numbness. EXAM: CT HEAD WITHOUT CONTRAST TECHNIQUE: Contiguous axial images were obtained from the base of the skull through the vertex without intravenous contrast. COMPARISON:  01/10/2009 FINDINGS: There is no intracranial hemorrhage, mass or evidence of acute infarction. There is mild generalized atrophy. There is remote lacunar infarction in the left frontal periventricular white matter. Gray-white differentiation is preserved. Posterior fossa is unremarkable. Visible portions of the paranasal sinuses and orbits are unremarkable. IMPRESSION: No acute intracranial findings. 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 These results were called by telephone at the time of interpretation on 09/18/2015 at 1:23 am to Dr. Theotis Burrow , who verbally acknowledged these results. Electronically Signed    By: Andreas Newport M.D.   On: 09/18/2015 01:23   I have personally reviewed and evaluated these lab results as part of my medical decision-making.   EKG Interpretation   Date/Time:  Tuesday Sep 18 2015 01:20:16 EDT Ventricular Rate:  105 PR Interval:  207 QRS Duration: 98 QT Interval:  341 QTC Calculation: 451 R Axis:   12 Text Interpretation:  Sinus tachycardia Borderline prolonged PR interval  Abnormal R-wave progression, early transition Baseline wander in lead(s)  V1 tachycardia new from previous Confirmed by Damel Querry MD, Angelene Rome [phone]) on  09/18/2015 1:40:18 AM       Medications - No data to display   2:48 AM- Discussed pt with hospitalist, Dr. Alcario Drought, who agrees to admit pt to inpatient tele.   MDM   Final diagnoses:  Numbness on right side  Ataxia  Patient with sudden onset of right-sided numbness and difficulty walking that began at 11:45 PM tonight. He was made a stroke alert by EMS in route and taken immediately to  the CT scanner, where he was met by the neurology team including Dr. Leonel Ramsay. Head CT was negative for acute intracranial bleed. On exam, he had significant ataxia and decreased sensation of right side. Based on these exam findings, Dr. Leonel Ramsay discussed option of tPA although given mild nature of patient's symptoms, he recommended that risks of tPA may outweigh benefits. Patient voiced understanding of risks and benefits and elected to proceed with conservative treatment and forego tPA. Obtained above lab work which was notable for hyperglycemia but otherwise unremarkable. EKG was sinus tachycardia. Discussed admission with hospitalist, Dr. Alcario Drought, and pt admitted for stroke work up.   I personally performed the services described in this documentation, which was scribed in my presence. The recorded information has been reviewed and is accurate.   Sharlett Iles, MD 09/18/15 [phone]

## 2015-09-18 NOTE — ED Notes (Signed)
Dr. Gardner at bedside 

## 2015-09-18 NOTE — Progress Notes (Signed)
STROKE TEAM PROGRESS NOTE   HISTORY OF PRESENT ILLNESS Kenneth Arnold is a 75 y.o. male with a history of hypertension and diabetes who presents with right-sided numbness that started as he was doing deep breathing exercises. He states that he would take a deep breath and then hold it for a while. Following this, he had sudden onset right-sided numbness of the arm and leg. He states that after EMS arrived and he tried to stand up, he also had some difficulty with walking. Of note, he had transient right foot numbness recently. He was LKW 09/17/2015 at 2345.  His NIH was 1, but after I stood him up and attempted to walk him, he did have significant difficulty with walking though he was able to ambulate. Given that he did have some difficulty with this, I did offer IV TPA. After discussion of the risks and benefits with the patient, it was decided against pursuing IV TPA.   He was admitted for further evaluation and treatment.   SUBJECTIVE (INTERVAL HISTORY) Patient presented with a 2 hr episode of Right hemibody numbness which has resolved. He denies any prior history of strokes or TIAs   OBJECTIVE Temp:  [98.1 F (36.7 C)-99 F (37.2 C)] 98.1 F (36.7 C) (05/02 0800) Pulse Rate:  [87-108] 92 (05/02 0800) Cardiac Rhythm:  [-] Normal sinus rhythm (05/02 0436) Resp:  [12-33] 18 (05/02 0800) BP: (140-173)/(96-120) 156/99 mmHg (05/02 0800) SpO2:  [94 %-100 %] 99 % (05/02 0800) Weight:  [67.586 kg (149 lb)-71.5 kg (157 lb 10.1 oz)] 71.5 kg (157 lb 10.1 oz) (05/02 0419)  CBC:  Recent Labs Lab 09/18/15 0111 09/18/15 0113  WBC 6.5  --   NEUTROABS 4.5  --   HGB 13.8 14.6  HCT 41.1 43.0  MCV 89.5  --   PLT 247  --     Basic Metabolic Panel:  Recent Labs Lab 09/18/15 0111 09/18/15 0113  NA 138 139  K 3.9 3.8  CL 102 100*  CO2 25  --   GLUCOSE 174* 175*  BUN 15 17  CREATININE 1.06 1.00  CALCIUM 9.6  --     Lipid Panel:    Component Value Date/Time   CHOL 222* 09/18/2015 0356    TRIG 279* 09/18/2015 0356   HDL 36* 09/18/2015 0356   CHOLHDL 6.2 09/18/2015 0356   VLDL 56* 09/18/2015 0356   LDLCALC 130* 09/18/2015 0356   HgbA1c: No results found for: HGBA1C Urine Drug Screen:    Component Value Date/Time   LABOPIA NONE DETECTED 09/18/2015 0144   COCAINSCRNUR NONE DETECTED 09/18/2015 0144   LABBENZ NONE DETECTED 09/18/2015 0144   AMPHETMU NONE DETECTED 09/18/2015 0144   THCU NONE DETECTED 09/18/2015 0144   LABBARB NONE DETECTED 09/18/2015 0144      IMAGING  Ct Head Wo Contrast  09/18/2015  CLINICAL DATA:  Right-sided numbness. EXAM: CT HEAD WITHOUT CONTRAST TECHNIQUE: Contiguous axial images were obtained from the base of the skull through the vertex without intravenous contrast. COMPARISON:  01/10/2009 FINDINGS: There is no intracranial hemorrhage, mass or evidence of acute infarction. There is mild generalized atrophy. There is remote lacunar infarction in the left frontal periventricular white matter. Gray-white differentiation is preserved. Posterior fossa is unremarkable. Visible portions of the paranasal sinuses and orbits are unremarkable. IMPRESSION: No acute intracranial findings. 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 These results were called by telephone at the time of interpretation on 09/18/2015 at 1:23 am to  Dr. Fleet ContrasACHEL LITTLE , who verbally acknowledged these results. Electronically Signed   By: Ellery Plunkaniel R Mitchell M.D.   On: 09/18/2015 01:23   Mr Brain Wo Contrast  09/18/2015  CLINICAL DATA:  New onset of right-sided numbness and weakness. Symptoms began earlier this evening at 11:45 p.m. Symptoms have been improving since onset and are now nearly resolved. Acute ischemic stroke. Initial presentation. EXAM: MRI HEAD WITHOUT CONTRAST MRA HEAD WITHOUT CONTRAST TECHNIQUE: Multiplanar, multiecho pulse sequences of the brain and surrounding structures were obtained without intravenous contrast.  Angiographic images of the head were obtained using MRA technique without contrast. COMPARISON:  CT head without contrast from the same day. FINDINGS: MRI HEAD FINDINGS An 8 mm focus of restricted diffusion is present within the lateral aspect of the left thalamus compatible with an acute nonhemorrhagic infarct. No other foci of restricted diffusion are present. There is faint T2 hyperintensity associated with the area of restricted diffusion. Moderate periventricular and subcortical white matter disease is present bilaterally. A remote cortical infarct is present in the right occipital lobe. The ventricles are proportionate to the degree of atrophy. Dilated perivascular spaces are evident within the basal ganglia. The internal auditory canals are within normal limits bilaterally. The brainstem and cerebellum are normal. Flow is present in the major intracranial arteries. The globes and orbits are intact. The paranasal sinuses are clear. There is some fluid in the left mastoid air cells. No obstructing nasopharyngeal lesion is evident. The skullbase is within normal limits. Midline sagittal images are unremarkable. 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 The internal carotid arteries are otherwise normal bilaterally. The A1 and M1 segments are within normal limits. There is moderate narrowing of the inferior posterior left M2 branch on the left and the superior anterior M2 branch on the right. There is some attenuation of distal MCA and ACA branch vessels. Moderate A2 segment stenoses are present bilaterally. The left vertebral artery is slightly dominant to the right. The left PICA origin is visualized and normal. The basilar artery is within normal limits. There is significant signal loss within the proximal posterior cerebral arteries bilaterally. Asymmetric attenuation of right PCA branch vessels is noted. IMPRESSION: 1. Acute  nonhemorrhagic 8 mm infarct within the lateral aspect of the left thalamus. 2. Moderate periventricular and subcortical T2 changes bilaterally reflect the sequela of chronic microvascular ischemia. 3. Moderate proximal M2, A2, and P2 segment stenoses as described above. 4. No significant more proximal stenoses. 5. 1 mm infundibulum of the right posterior communicating artery. These results were called by telephone at the time of interpretation on 09/18/2015 at 9:09 am to Dr. Dartha Lodgegbata, who verbally acknowledged these results. Electronically Signed   By: Marin Robertshristopher  Mattern M.D.   On: 09/18/2015 09:17   Mr Maxine GlennMra Head/brain Wo Cm  09/18/2015  CLINICAL DATA:  New onset of right-sided numbness and weakness. Symptoms began earlier this evening at 11:45 p.m. Symptoms have been improving since onset and are now nearly resolved. Acute ischemic stroke. Initial presentation. EXAM: MRI HEAD WITHOUT CONTRAST MRA HEAD WITHOUT CONTRAST TECHNIQUE: Multiplanar, multiecho pulse sequences of the brain and surrounding structures were obtained without intravenous contrast. Angiographic images of the head were obtained using MRA technique without contrast. COMPARISON:  CT head without contrast from the same day. FINDINGS: MRI HEAD FINDINGS An 8 mm focus of restricted diffusion is present within the lateral aspect of the left thalamus compatible with an acute nonhemorrhagic infarct. No  other foci of restricted diffusion are present. There is faint T2 hyperintensity associated with the area of restricted diffusion. Moderate periventricular and subcortical white matter disease is present bilaterally. A remote cortical infarct is present in the right occipital lobe. The ventricles are proportionate to the degree of atrophy. Dilated perivascular spaces are evident within the basal ganglia. The internal auditory canals are within normal limits bilaterally. The brainstem and cerebellum are normal. Flow is present in the major intracranial  arteries. The globes and orbits are intact. The paranasal sinuses are clear. There is some fluid in the left mastoid air cells. No obstructing nasopharyngeal lesion is evident. The skullbase is within normal limits. Midline sagittal images are unremarkable. 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 The internal carotid arteries are otherwise normal bilaterally. The A1 and M1 segments are within normal limits. There is moderate narrowing of the inferior posterior left M2 branch on the left and the superior anterior M2 branch on the right. There is some attenuation of distal MCA and ACA branch vessels. Moderate A2 segment stenoses are present bilaterally. The left vertebral artery is slightly dominant to the right. The left PICA origin is visualized and normal. The basilar artery is within normal limits. There is significant signal loss within the proximal posterior cerebral arteries bilaterally. Asymmetric attenuation of right PCA branch vessels is noted. IMPRESSION: 1. Acute nonhemorrhagic 8 mm infarct within the lateral aspect of the left thalamus. 2. Moderate periventricular and subcortical T2 changes bilaterally reflect the sequela of chronic microvascular ischemia. 3. Moderate proximal M2, A2, and P2 segment stenoses as described above. 4. No significant more proximal stenoses. 5. 1 mm infundibulum of the right posterior communicating artery. These results were called by telephone at the time of interpretation on 09/18/2015 at 9:09 am to Dr. Dartha Lodge, who verbally acknowledged these results. Electronically Signed   By: Marin Roberts M.D.   On: 09/18/2015 09:17     PHYSICAL EXAM Pleasant elderly gentleman currently not in distress. . Afebrile. Head is nontraumatic. Neck is supple without bruit.    Cardiac exam no murmur or gallop. Lungs are clear to auscultation. Distal pulses are well felt. Neurological Exam ;  Awake  Alert  oriented x 3. Normal speech and language.eye movements full without nystagmus.fundi were not visualized. Vision acuity and fields appear normal. Hearing is normal. Palatal movements are normal. Face symmetric. Tongue midline. Normal strength, tone, reflexes and coordination. Normal sensation. Gait deferred. ASSESSMENT/PLAN Mr. Kenneth Arnold is a 75 y.o. male with history of HTN and DB presenting with right side numbness. He did not receive IV t-PA due to mild deficits, pt decision.   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 R PCA infundibulum  Carotid Doppler  pending   2D Echo  pending   LDL 130  HgbA1c pending  Lovenox 40 mg sq daily for VTE prophylaxis  Diet Carb Modified Fluid consistency:: Thin; Room service appropriate?: Yes  aspirin 81 mg daily prior to admission, now on aspirin 325 mg daily. Given stroke on aspirin, will change to plavix.   Patient counseled to be compliant with his antithrombotic medications  Ongoing aggressive stroke risk factor management  healthy diet and exercise advised. Avoid soda  Therapy recommendations:  pending   Disposition:  pending   Hypertensive Emergency  BP as high as 172/109  Improved  Hyperlipidemia  Home meds:  130  LDL 130, goal < 70  Add statin - lipitor 40  Diabetes  HgbA1c pending , goal < 7.0  Other Stroke Risk Factors  Advanced age  Family hx stroke (father)  Hospital day # 0  [facility] See Amion for Pager information 09/18/2015 4:13 PM   I have personally examined this patient, reviewed notes, independently viewed imaging studies, participated in medical decision making and plan of care. I have made any additions or clarifications directly to the above note. Agree with note above. He presented with transient right body numbness due to small left thalamic infarct likely due to small vessel  disease.and remains at risk for neurological worsening, recurrent stroke, TIA needs ongoing stroke evaluation.Greater than 50% of time during this 25 minute visit was spent on counseling and coordination of care. Delia Heady, MD Medical Director [facility] Pager: [phone] 09/18/2015 4:44 PM   To contact Stroke Continuity provider, please refer to [email or web address]. After hours, contact General Neurology

## 2015-09-18 NOTE — Progress Notes (Signed)
Echocardiogram 2D Echocardiogram has been performed.  09/18/2015 4:59 PM Gertie FeyMichelle Keavon Sensing, RVT, RDCS, RDMS

## 2015-09-18 NOTE — ED Notes (Signed)
Pt to ED as a Code Stroke, LSN 2345. Pt c/o R sided numbness and abnormal gait. Pt ambulated with neurologist, unsteady gait noted with ambulation. Pt reports ambulation is worse than baseline

## 2015-09-18 NOTE — Care Management Note (Signed)
Case Management Note  Patient Details  Name: Kenneth Arnold MRN: [id number] Date of Birth: September 06, 1940  Subjective/Objective:                    Action/Plan: Patient was admitted with CVA. Lives at home with family. Will follow for discharge needs pending PT/OT evals and physician orders.  Expected Discharge Date:                  Expected Discharge Plan:     In-House Referral:     Discharge planning Services     Post Acute Care Choice:    Choice offered to:     DME Arranged:    DME Agency:     HH Arranged:    HH Agency:     Status of Service:  In process, will continue to follow  Medicare Important Message Given:    Date Medicare IM Given:    Medicare IM give by:    Date Additional Medicare IM Given:    Additional Medicare Important Message give by:     If discussed at Long Length of Stay Meetings, dates discussed:    Additional CommentsAnda Arnold:  [name] C, RN 09/18/2015, 11:07 AM [phone]

## 2015-09-18 NOTE — Progress Notes (Signed)
Pt off the floor during 1600 vitals and neuro check.  Will continue to monitor. Sondra ComeSilva, [place] Wernli M, RN

## 2015-09-18 NOTE — Progress Notes (Signed)
Code Stroke called on 75 y.o male.  Pertinent medical history includes HTN and DM. LSN 2345. Pt with acute onset right leg numbness and gait disturbance. Brought to [facility] per EMS from home. Labs drawn and Pt taken to CT scan STAT. CT completed and reviewed per Neurologist Dr. Amada JupiterKirkpatrick. Upon arrival to ED room NIHSS completed yielding 1 for sensory deficit on right side. Pt observed walking with noted gait instability. CBG 163. TPA discussed with Pt per Neurologist, decided against due to mild symptoms. Pt to remain within TPA window until 0415. For admit for full stroke workup.

## 2015-09-18 NOTE — Progress Notes (Signed)
Patient arrived to 5M22 AAOx4. He was able to walk to the bed from the stretcher with minimal assist. Vitals taken and tele applied. Will continue to monitor. Erma Raiche, [place] ScrapeSarah E, RN

## 2015-09-18 NOTE — Progress Notes (Signed)
*  PRELIMINARY RESULTS* Vascular Ultrasound Carotid Duplex (Doppler) has been completed.  There is no obvious evidence of hemodynamically significant internal carotid artery stenosis bilaterally. Vertebral arteries are patent with antegrade flow.  09/18/2015 5:00 PM Gertie FeyMichelle Bradi [place], RVT, RDCS, RDMS

## 2015-09-19 DIAGNOSIS — I639 Cerebral infarction, unspecified: Secondary | ICD-10-CM

## 2015-09-19 DIAGNOSIS — I10 Essential (primary) hypertension: Secondary | ICD-10-CM

## 2015-09-19 LAB — HEMOGLOBIN A1C
HEMOGLOBIN A1C: 8 % — AB (ref 4.8–5.6)
MEAN PLASMA GLUCOSE: 183 mg/dL

## 2015-09-19 LAB — GLUCOSE, CAPILLARY
Glucose-Capillary: 175 mg/dL — ABNORMAL HIGH (ref 65–99)
Glucose-Capillary: 178 mg/dL — ABNORMAL HIGH (ref 65–99)
Glucose-Capillary: 138 mg/dL — ABNORMAL HIGH (ref 65–99)

## 2015-09-19 MED ORDER — FLUTICASONE PROPIONATE 50 MCG/ACT NA SUSP
2.0000 | Freq: Every day | NASAL | Status: DC
  Administered 2015-09-19: 2 via NASAL
  Filled 2015-09-19: qty 16

## 2015-09-19 MED ORDER — CLOPIDOGREL BISULFATE 75 MG PO TABS: 75.0000 mg | ORAL_TABLET | Freq: Every day | ORAL | Status: AC

## 2015-09-19 MED ORDER — KETOROLAC TROMETHAMINE 30 MG/ML IJ SOLN
30.0000 mg | Freq: Once | INTRAMUSCULAR | Status: AC
  Administered 2015-09-19: 30 mg via INTRAVENOUS
  Filled 2015-09-19: qty 1

## 2015-09-19 MED ORDER — ATORVASTATIN CALCIUM 40 MG PO TABS: 40.0000 mg | ORAL_TABLET | Freq: Every day | ORAL | Status: AC

## 2015-09-19 NOTE — Progress Notes (Signed)
PROGRESS NOTE    Kenneth Arnold  ZOX:[id number] DOB: April 27, 1941 DOA: 09/18/2015 PCP: No primary care provider on file.  Outpatient Specialists:  Brief Narrative:  75 y.o. male with medical history significant of DM2, HTN. Patient presents to the ED with c/o sudden onset of R sided numbness and weakness. MRI of the brain reveals 8 millimeter left thalamic infarct. Patient is improving but still reports some numbness involving the right hand.  Assessment & Plan:  Principal Problem:  Acute left thalamic infarct (CVA) Active Problems:  HTN (hypertension)  DM2 (diabetes mellitus, type 2) (HCC)   Acute left thalamic infarct - On plavix. Await PT/OT input. Likely DC today after clearance by Neurology and PT/OT. HTN - Remains optimized considering acute infarct and patient's age. DM2 - Optimize   DVT prophylaxis: Lovenox Code Status: Full Family Communication: Family at bedside Consults called: Neurology Admission status: Inpatient  Subjective: Mild right palm numbness  Objective: Filed Vitals:   09/18/15 1741 09/18/15 2143 09/19/15 0111 09/19/15 0512  BP: 124/88 143/87 150/99 147/97  Pulse: 79 81 86 84  Temp: 98.6 F (37 C) 98.7 F (37.1 C) 98.5 F (36.9 C) 98.2 F (36.8 C)  TempSrc: Oral Oral Oral Oral  Resp: Height:      Weight:      SpO2: 98% 98% 97% 97%   No intake or output data in the 24 hours ending 09/19/15 0841 Filed Weights   09/18/15 0130 09/18/15 0419  Weight: 67.586 kg (149 lb) 71.5 kg (157 lb 10.1 oz)    Examination:  General exam: Appears calm and comfortable  Respiratory system: Clear to auscultation. Respiratory effort normal. Cardiovascular system: S1 & S2. Gastrointestinal system: Abdomen is nondistended, soft and nontender.  Central nervous system: Alert and oriented. Moves all limbs. Extremities:  No leg edema.  Data Reviewed: I have personally reviewed following labs and imaging studies  CBC:  Recent  Labs Lab 09/18/15 0111 09/18/15 0113  WBC 6.5  --   NEUTROABS 4.5  --   HGB 13.8 14.6  HCT 41.1 43.0  MCV 89.5  --   PLT 247  --    Basic Metabolic Panel:  Recent Labs Lab 09/18/15 0111 09/18/15 0113  NA 138 139  K 3.9 3.8  CL 102 100*  CO2 25  --   GLUCOSE 174* 175*  BUN 15 17  CREATININE 1.06 1.00  CALCIUM 9.6  --    GFR: Estimated Creatinine Clearance: 62.8 mL/min (by C-G formula based on Cr of 1). Liver Function Tests:  Recent Labs Lab 09/18/15 0111  AST 18  ALT 15*  ALKPHOS 62  BILITOT 0.5  PROT 6.7  ALBUMIN 3.9   No results for input(s): LIPASE, AMYLASE in the last 168 hours. No results for input(s): AMMONIA in the last 168 hours. Coagulation Profile:  Recent Labs Lab 09/18/15 0111  INR 1.07   Cardiac Enzymes: No results for input(s): CKTOTAL, CKMB, CKMBINDEX, TROPONINI in the last 168 hours. BNP (last 3 results) No results for input(s): PROBNP in the last 8760 hours. HbA1C:  Recent Labs  09/18/15 0356  HGBA1C 8.0*   CBG:  Recent Labs Lab 09/18/15 0807 09/18/15 1157 09/18/15 1737 09/18/15 2229 09/19/15 0634  GLUCAP 137* 139* 164* 193* 175*   Lipid Profile:  Recent Labs  09/18/15 0356  CHOL 222*  HDL 36*  LDLCALC 130*  TRIG 279*  CHOLHDL 6.2   Thyroid Function Tests: No results for input(s): TSH, T4TOTAL, FREET4, T3FREE, THYROIDAB in  the last 72 hours. Anemia Panel: No results for input(s): VITAMINB12, FOLATE, FERRITIN, TIBC, IRON, RETICCTPCT in the last 72 hours. Urine analysis:    Component Value Date/Time   COLORURINE STRAW* 09/18/2015 0144   APPEARANCEUR CLEAR 09/18/2015 0144   LABSPEC 1.008 09/18/2015 0144   PHURINE 6.5 09/18/2015 0144   GLUCOSEU NEGATIVE 09/18/2015 0144   HGBUR NEGATIVE 09/18/2015 0144   BILIRUBINUR NEGATIVE 09/18/2015 0144   KETONESUR NEGATIVE 09/18/2015 0144   PROTEINUR 100* 09/18/2015 0144   UROBILINOGEN 0.2 04/30/2009 1719   NITRITE NEGATIVE 09/18/2015 0144   LEUKOCYTESUR NEGATIVE  09/18/2015 0144   Sepsis Labs: @LABRCNTIP (procalcitonin:4,lacticidven:4)  )No results found for this or any previous visit (from the past 240 hour(s)).       Radiology Studies: Ct Head Wo Contrast  09/18/2015  CLINICAL DATA:  Right-sided numbness. EXAM: CT HEAD WITHOUT CONTRAST TECHNIQUE: Contiguous axial images were obtained from the base of the skull through the vertex without intravenous contrast. COMPARISON:  01/10/2009 FINDINGS: There is no intracranial hemorrhage, mass or evidence of acute infarction. There is mild generalized atrophy. There is remote lacunar infarction in the left frontal periventricular white matter. Gray-white differentiation is preserved. Posterior fossa is unremarkable. Visible portions of the paranasal sinuses and orbits are unremarkable. IMPRESSION: No acute intracranial findings. 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 These results were called by telephone at the time of interpretation on 09/18/2015 at 1:23 am to Dr. Frederick Peers , who verbally acknowledged these results. Electronically Signed   By: Ellery Plunk M.D.   On: 09/18/2015 01:23   Mr Brain Wo Contrast  09/18/2015  CLINICAL DATA:  New onset of right-sided numbness and weakness. Symptoms began earlier this evening at 11:45 p.m. Symptoms have been improving since onset and are now nearly resolved. Acute ischemic stroke. Initial presentation. EXAM: MRI HEAD WITHOUT CONTRAST MRA HEAD WITHOUT CONTRAST TECHNIQUE: Multiplanar, multiecho pulse sequences of the brain and surrounding structures were obtained without intravenous contrast. Angiographic images of the head were obtained using MRA technique without contrast. COMPARISON:  CT head without contrast from the same day. FINDINGS: MRI HEAD FINDINGS An 8 mm focus of restricted diffusion is present within the lateral aspect of the left thalamus compatible with an acute nonhemorrhagic infarct. No  other foci of restricted diffusion are present. There is faint T2 hyperintensity associated with the area of restricted diffusion. Moderate periventricular and subcortical white matter disease is present bilaterally. A remote cortical infarct is present in the right occipital lobe. The ventricles are proportionate to the degree of atrophy. Dilated perivascular spaces are evident within the basal ganglia. The internal auditory canals are within normal limits bilaterally. The brainstem and cerebellum are normal. Flow is present in the major intracranial arteries. The globes and orbits are intact. The paranasal sinuses are clear. There is some fluid in the left mastoid air cells. No obstructing nasopharyngeal lesion is evident. The skullbase is within normal limits. Midline sagittal images are unremarkable. 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 The internal carotid arteries are otherwise normal bilaterally. The A1 and M1 segments are within normal limits. There is moderate narrowing of the inferior posterior left M2 branch on the left and the superior anterior M2 branch on the right. There is some attenuation of distal MCA and ACA branch vessels. Moderate A2 segment stenoses are present bilaterally. The left vertebral artery is slightly dominant to the right. The  left PICA origin is visualized and normal. The basilar artery is within normal limits. There is significant signal loss within the proximal posterior cerebral arteries bilaterally. Asymmetric attenuation of right PCA branch vessels is noted. IMPRESSION: 1. Acute nonhemorrhagic 8 mm infarct within the lateral aspect of the left thalamus. 2. Moderate periventricular and subcortical T2 changes bilaterally reflect the sequela of chronic microvascular ischemia. 3. Moderate proximal M2, A2, and P2 segment stenoses as described above. 4. No significant more proximal stenoses. 5. 1 mm  infundibulum of the right posterior communicating artery. These results were called by telephone at the time of interpretation on 09/18/2015 at 9:09 am to Dr. Dartha Lodgegbata, who verbally acknowledged these results. Electronically Signed   By: Marin Robertshristopher  Mattern M.D.   On: 09/18/2015 09:17   Mr Maxine GlennMra Head/brain Wo Cm  09/18/2015  CLINICAL DATA:  New onset of right-sided numbness and weakness. Symptoms began earlier this evening at 11:45 p.m. Symptoms have been improving since onset and are now nearly resolved. Acute ischemic stroke. Initial presentation. EXAM: MRI HEAD WITHOUT CONTRAST MRA HEAD WITHOUT CONTRAST TECHNIQUE: Multiplanar, multiecho pulse sequences of the brain and surrounding structures were obtained without intravenous contrast. Angiographic images of the head were obtained using MRA technique without contrast. COMPARISON:  CT head without contrast from the same day. FINDINGS: MRI HEAD FINDINGS An 8 mm focus of restricted diffusion is present within the lateral aspect of the left thalamus compatible with an acute nonhemorrhagic infarct. No other foci of restricted diffusion are present. There is faint T2 hyperintensity associated with the area of restricted diffusion. Moderate periventricular and subcortical white matter disease is present bilaterally. A remote cortical infarct is present in the right occipital lobe. The ventricles are proportionate to the degree of atrophy. Dilated perivascular spaces are evident within the basal ganglia. The internal auditory canals are within normal limits bilaterally. The brainstem and cerebellum are normal. Flow is present in the major intracranial arteries. The globes and orbits are intact. The paranasal sinuses are clear. There is some fluid in the left mastoid air cells. No obstructing nasopharyngeal lesion is evident. The skullbase is within normal limits. Midline sagittal images are unremarkable. MRA HEAD FINDINGS A 1 mm aneurysm or more likely infundibulum is  present at the right posterior communicating artery. The internal carotid arteries are otherwise normal bilaterally. The A1 and M1 segments are within normal limits. There is moderate narrowing of the inferior posterior left M2 branch on the left and the superior anterior M2 branch on the right. There is some attenuation of distal MCA and ACA branch vessels. Moderate A2 segment stenoses are present bilaterally. The left vertebral artery is slightly dominant to the right. The left PICA origin is visualized and normal. The basilar artery is within normal limits. There is significant signal loss within the proximal posterior cerebral arteries bilaterally. Asymmetric attenuation of right PCA branch vessels is noted. IMPRESSION: 1. Acute nonhemorrhagic 8 mm infarct within the lateral aspect of the left thalamus. 2. Moderate periventricular and subcortical T2 changes bilaterally reflect the sequela of chronic microvascular ischemia. 3. Moderate proximal M2, A2, and P2 segment stenoses as described above. 4. No significant more proximal stenoses. 5. 1 mm infundibulum of the right posterior communicating artery. These results were called by telephone at the time of interpretation on 09/18/2015 at 9:09 am to Dr. Dartha Lodgegbata, who verbally acknowledged these results. Electronically Signed   By: Marin Robertshristopher  Mattern M.D.   On: 09/18/2015 09:17        Scheduled Meds: .  atorvastatin  40 mg Oral q1800  . clopidogrel  75 mg Oral Daily  . enoxaparin (LOVENOX) injection  40 mg Subcutaneous Daily  . insulin aspart  0-9 Units Subcutaneous TID WC  . pantoprazole  80 mg Oral Q1200   Continuous Infusions:    LOS: 1 day    Time spent: 25 Minutes    Barnetta Chapel, MD [facility] Pager [phone]  If 7PM-7AM, please contact night-coverage [email or web address] Password [email or web address] 09/19/2015, 8:41 AM

## 2015-09-19 NOTE — Progress Notes (Signed)
Discharge orders received.  Discharge instructions and follow-up appointments reviewed with the patient and his daughters.  VSS upon discharge.  IV removed and education complete.  All belongings sent with the patient.  Transported out via wheelchair.  Sondra ComeSilva, Nikhita Mentzel M, RN

## 2015-09-19 NOTE — Evaluation (Signed)
Occupational Therapy Evaluation Patient Details Name: Kenneth Arnold MRN: [id number] DOB: August 22, 1940 Today's Date: 09/19/2015    History of Present Illness Patient is a 75 y/o male with hx of HTN and DM presents with R sided numbness/weakness. MRI-8 millimeter left thalamic infarct.    Clinical Impression   Patient presenting with decreased ADL and functional mobility independence secondary to above. Patient independent PTA. Patient currently functioning at an overall min assist level with no use of an AD or DME. Patient will benefit from acute OT to increase overall independence in the areas of ADLs, functional mobility, and overall safety in order to safely discharge home with assistance from family and HHOT.      Follow Up Recommendations  Home health OT;Supervision/Assistance - 24 hour    Equipment Recommendations  3 in 1 bedside comode    Recommendations for Other Services  None at this time   Precautions / Restrictions Precautions Precautions: Fall Restrictions Weight Bearing Restrictions: No    Mobility Bed Mobility Overal bed mobility: Needs Assistance Bed Mobility: Supine to Sit;Sit to Supine     Supine to sit: Modified independent (Device/Increase time) Sit to supine: Modified independent (Device/Increase time)   General bed mobility comments: No assist needed.   Transfers Overall transfer level: Needs assistance Equipment used: None Transfers: Sit to/from Stand Sit to Stand: Min guard General transfer comment: Min guard for safety. Pt with minor LOB during functional ambulation (turning to sit onto couch)     Balance Overall balance assessment: Needs assistance Sitting-balance support: No upper extremity supported;Feet supported Sitting balance-Leahy Scale: Normal     Standing balance support: No upper extremity supported;During functional activity Standing balance-Leahy Scale: Fair Standing balance comment: Pt with LOB during turning around R side to sit  onto couch    ADL Overall ADL's : Needs assistance/impaired Eating/Feeding: Set up;Bed level   Grooming: Set up;Bed level   Upper Body Bathing: Minimal assitance;Sitting   Lower Body Bathing: Minimal assistance;Sit to/from stand   Upper Body Dressing : Minimal assistance;Sitting   Lower Body Dressing: Minimal assistance;Sit to/from stand   Toilet Transfer: Solicitor;Ambulation;Grab bars Toilet Transfer Details (indicate cue type and reason): pt will benefit from [facility]         Functional mobility during ADLs: Minimal assistance;Cueing for safety;Cueing for sequencing General ADL Comments: Pt refused to use RW during functional ambulation/mobility. Educated pt on importance of using RW for his safety. Pt ambulated around bed to couch to perform LB dressing. Pt ambulated into BR for toilet transfer using regular height toilet seat.      Vision Additional Comments: No apparent visual deficits. Pt with headache and had difficult time holding eyes open          Pertinent Vitals/Pain Pain Assessment: Faces Faces Pain Scale: Hurts whole lot Pain Location: headache  Pain Descriptors / Indicators: Headache Pain Intervention(s): Limited activity within patient's tolerance;Monitored during session;Premedicated before session;Patient requesting pain meds-RN notified     Hand Dominance Right   Extremity/Trunk Assessment Upper Extremity Assessment Upper Extremity Assessment: Generalized weakness (RUE AND LUE with generalized weakness, R did not seem much weaker than L during MMT) RUE Sensation: decreased light touch   Lower Extremity Assessment Lower Extremity Assessment: RLE deficits/detail RLE Deficits / Details: ~4/5 throughout RLE.  RLE Sensation:  (Proprioception WFL)   Cervical / Trunk Assessment Cervical / Trunk Assessment: Normal   Communication Communication Communication: No difficulties   Cognition Arousal/Alertness: Awake/alert Behavior During  Therapy: WFL for tasks assessed/performed Overall Cognitive  Status: Within Functional Limits for tasks assessed (decreased safety awareness regarding need for RW for his safety)              Home Living Family/patient expects to be discharged to:: Private residence Living Arrangements: Children;Other relatives (daughter, son in law, grand children) Available Help at Discharge: Family;Available PRN/intermittently;Available 24 hours/day (grandchildren who are not working) Type of Home: House Home Access: Level entry     Home Layout: One level     Bathroom Shower/Tub: Chief Strategy OfficerTub/shower unit   Bathroom Toilet: Standard     Home Equipment: None   Prior Functioning/Environment Level of Independence: Independent  Comments: Reports only household ambulation and not very far at baseline.    OT Diagnosis: Generalized weakness;Hemiplegia dominant side;Acute pain   OT Problem List: Decreased strength;Decreased activity tolerance;Impaired balance (sitting and/or standing);Decreased coordination;Decreased safety awareness;Decreased knowledge of use of DME or AE;Decreased knowledge of precautions;Pain   OT Treatment/Interventions: Self-care/ADL training;Therapeutic exercise;Energy conservation;DME and/or AE instruction;Therapeutic activities;Patient/family education;Balance training    OT Goals(Current goals can be found in the care plan section) Acute Rehab OT Goals Patient Stated Goal: get rid of this headache OT Goal Formulation: With patient Time For Goal Achievement: 09/26/15 Potential to Achieve Goals: Good ADL Goals Pt Will Perform Grooming: Independently;standing Pt Will Transfer to Toilet: ambulating;with modified independence;bedside commode Pt Will Perform Tub/Shower Transfer: Tub transfer;ambulating;3 in 1;rolling walker;with modified independence Pt/caregiver will Perform Home Exercise Program: Increased strength;Both right and left upper extremity;With theraband;With  Supervision;With written HEP provided Additional ADL Goal #1: Pt will be mod I with functional ambulation/mobility using LRAD during ADL   OT Frequency: Min 3X/week   Barriers to D/C: Unsure, daughter & son-in law work, pt reports grandchildren can assist   End of Session Nurse Communication: Mobility status;Other (comment) (recommendation for BSC and HHOT)  Activity Tolerance: Patient tolerated treatment well Patient left: in bed;with call bell/phone within reach;Other (comment) (with Dr present)   Time: 8295-6213: 1336-1350 OT Time Calculation (min): 14 min Charges:  OT General Charges $OT Visit: 1 Procedure OT Evaluation $OT Eval Moderate Complexity: 1 Procedure  Edwin CapPatricia Dontrey Snellgrove , MS, OTR/L, CLT Pager: [phone]  09/19/2015, 2:03 PM

## 2015-09-19 NOTE — Progress Notes (Signed)
Pt SLP Cancellation Note  Patient Details Name: Kenneth Arnold MRN: [id number] DOB: 1941-04-02   Cancelled treatment:        Patient has severe headache and nausea. Hold evaluation until tomorrow.    Lindalou HoseSarah J Jourdin Connors 09/19/2015, 2:13 PM

## 2015-09-19 NOTE — Care Management Note (Signed)
Case Management Note  Patient Details  Name: Kenneth Arnold MRN: [id number] Date of Birth: 10-01-1940  Subjective/Objective:                    Action/Plan: Patient discharging home with [facility] [place] services and DME. CM met with the patient and provided him a list of [facility] agencies in the [place] area. He selected [facility]. Drew with [facility] notified and accepted the referral. Jermaine with [facility] notified of the walker and 3 in 1 and will deliver the equipment to the room. Bedside RN updated.   Expected Discharge Date:                  Expected Discharge Plan:  [place]  In-House Referral:     Discharge planning Services  CM Consult  Post Acute Care Choice:  Durable Medical Equipment, Home Health Choice offered to:  Patient  DME Arranged:  3-N-1, Walker rolling DME Agency:  [place] Arranged:  PT, OT [facility] Agency:  [place]  Status of Service:  Completed, signed off  Medicare Important Message Given:    Date Medicare IM Given:    Medicare IM give by:    Date Additional Medicare IM Given:    Additional Medicare Important Message give by:     If discussed at [place] of Stay Meetings, dates discussed:    Additional Comments:  Pollie Friar, RN 09/19/2015, 2:30 PM

## 2015-09-19 NOTE — Evaluation (Signed)
Physical Therapy Evaluation Patient Details Name: Kenneth GosselinDarrell Arnold MRN: [id number] DOB: 1941-01-24 Today's Date: 09/19/2015   History of Present Illness  Patient is a 75 y/o male with hx of HTN and DM presents with R sided numbness/weakness. MRI-8 millimeter left thalamic infarct.   Clinical Impression  Patient presents with mild right sided weakness/numbness and imbalance s/p CVA impacting mobility. Tolerated gait training with and without RW and balance much improved with use of RW for support. Discussed importance of using RW at home for safety. Pt has support from granddaughter at home. Will follow acutely to maximize independence and mobility prior to return home.     Follow Up Recommendations Home health PT;Supervision for mobility/OOB    Equipment Recommendations  Rolling walker with 5" wheels;3in1 (PT)    Recommendations for Other Services OT consult     Precautions / Restrictions Precautions Precautions: Fall Restrictions Weight Bearing Restrictions: No      Mobility  Bed Mobility Overal bed mobility: Needs Assistance Bed Mobility: Supine to Sit;Sit to Supine     Supine to sit: Modified independent (Device/Increase time) Sit to supine: Modified independent (Device/Increase time)   General bed mobility comments: No assist needed.   Transfers Overall transfer level: Needs assistance Equipment used: Rolling walker (2 wheeled) Transfers: Sit to/from Stand Sit to Stand: Supervision         General transfer comment: Supervision for safety. Stood from [employer].   Ambulation/Gait Ambulation/Gait assistance: Min guard Ambulation Distance (Feet): 150 Feet Assistive device: Rolling walker (2 wheeled) Gait Pattern/deviations: Step-through pattern;Staggering right;Staggering left;Drifts right/left;Trunk flexed Gait velocity: decreased   General Gait Details: Cues to decrease step  length esp with RLE and to use visual cues as needed due to numbness. Ambulated with and  without RW. More steady with RW.   Stairs            Wheelchair Mobility    Modified Rankin (Stroke Patients Only) Modified Rankin (Stroke Patients Only) Pre-Morbid Rankin Score: Slight disability Modified Rankin: Moderately severe disability     Balance Overall balance assessment: Needs assistance Sitting-balance support: Feet supported;No upper extremity supported Sitting balance-Leahy Scale: Normal     Standing balance support: During functional activity Standing balance-Leahy Scale: Fair Standing balance comment: Balance improved with 1 UE support.                             Pertinent Vitals/Pain Pain Assessment: Faces Faces Pain Scale: Hurts a little bit Pain Location: headache Pain Descriptors / Indicators: Headache Pain Intervention(s): Monitored during session;Repositioned    Home Living Family/patient expects to be discharged to:: Private residence Living Arrangements: Children;Other relatives (daughter, son in law and granddaughter) Available Help at Discharge: Family;Available PRN/intermittently;Available 24 hours/day (granddaughter is not working right now and will be there) Type of Home: House Home Access: Level entry     Home Layout: One level Home Equipment: None      Prior Function Level of Independence: Independent         Comments: Reports only household ambulation and not very far at baseline.     Hand Dominance        Extremity/Trunk Assessment   Upper Extremity Assessment: Defer to OT evaluation;RUE deficits/detail (No drift noted)     RUE Sensation: decreased light touch     Lower Extremity Assessment: RLE deficits/detail RLE Deficits / Details: ~4/5 throughout RLE.        Communication   Communication: No difficulties  Cognition  Arousal/Alertness: Awake/alert Behavior During Therapy: WFL for tasks assessed/performed Overall Cognitive Status: Within Functional Limits for tasks assessed                       General Comments General comments (skin integrity, edema, etc.): Discussed having a seat in the shower for safety and using RW for home.    Exercises        Assessment/Plan    PT Assessment Patient needs continued PT services  PT Diagnosis Difficulty walking   PT Problem List Decreased strength;Pain;Impaired sensation;Decreased balance;Decreased mobility  PT Treatment Interventions Balance training;Gait training;Functional mobility training;Therapeutic activities;Therapeutic exercise;Patient/family education;DME instruction   PT Goals (Current goals can be found in the Care Plan section) Acute Rehab PT Goals Patient Stated Goal: to return home PT Goal Formulation: With patient Time For Goal Achievement: 10/03/15 Potential to Achieve Goals: Good    Frequency Min 4X/week   Barriers to discharge        Co-evaluation               End of Session Equipment Utilized During Treatment: Gait belt Activity Tolerance: Patient tolerated treatment well Patient left: in bed;with call bell/phone within reach;with bed alarm set Nurse Communication: Mobility status         Time: 2595-6387 PT Time Calculation (min) (ACUTE ONLY): 19 min   Charges:   PT Evaluation $PT Eval Moderate Complexity: 1 Procedure     PT G Codes:        Kenneth Arnold 09/19/2015, 11:21 AM Kenneth Arnold, PT, DPT [phone]

## 2015-09-19 NOTE — Progress Notes (Signed)
STROKE TEAM PROGRESS NOTE   HISTORY OF PRESENT ILLNESS Kenneth Arnold is a 75 y.o. male with a history of hypertension and diabetes who presents with right-sided numbness that started as he was doing deep breathing exercises. He states that he would take a deep breath and then hold it for a while. Following this, he had sudden onset right-sided numbness of the arm and leg. He states that after EMS arrived and he tried to stand up, he also had some difficulty with walking. Of note, he had transient right foot numbness recently. He was LKW 09/17/2015 at 2345.  His NIH was 1, but after I stood him up and attempted to walk him, he did have significant difficulty with walking though he was able to ambulate. Given that he did have some difficulty with this, I did offer IV TPA. After discussion of the risks and benefits with the patient, it was decided against pursuing IV TPA.   He was admitted for further evaluation and treatment.   SUBJECTIVE (INTERVAL HISTORY) Patient states his Right hemibody numbness has improved. He denies any new complaints  OBJECTIVE Temp:  [98.2 F (36.8 C)-98.9 F (37.2 C)] 98.7 F (37.1 C) (05/03 1426) Pulse Rate:  [75-86] 78 (05/03 1426) Cardiac Rhythm:  [-] Normal sinus rhythm (05/03 0700) Resp:  [15-17] 15 (05/03 1426) BP: (124-150)/(81-99) 133/81 mmHg (05/03 1426) SpO2:  [97 %-99 %] 99 % (05/03 1426)  CBC:   Recent Labs Lab 09/18/15 0111 09/18/15 0113  WBC 6.5  --   NEUTROABS 4.5  --   HGB 13.8 14.6  HCT 41.1 43.0  MCV 89.5  --   PLT 247  --     Basic Metabolic Panel:   Recent Labs Lab 09/18/15 0111 09/18/15 0113  NA 138 139  K 3.9 3.8  CL 102 100*  CO2 25  --   GLUCOSE 174* 175*  BUN 15 17  CREATININE 1.06 1.00  CALCIUM 9.6  --     Lipid Panel:     Component Value Date/Time   CHOL 222* 09/18/2015 0356   TRIG 279* 09/18/2015 0356   HDL 36* 09/18/2015 0356   CHOLHDL 6.2 09/18/2015 0356   VLDL 56* 09/18/2015 0356   LDLCALC 130*  09/18/2015 0356   HgbA1c:  Lab Results  Component Value Date   HGBA1C 8.0* 09/18/2015   Urine Drug Screen:     Component Value Date/Time   LABOPIA NONE DETECTED 09/18/2015 0144   COCAINSCRNUR NONE DETECTED 09/18/2015 0144   LABBENZ NONE DETECTED 09/18/2015 0144   AMPHETMU NONE DETECTED 09/18/2015 0144   THCU NONE DETECTED 09/18/2015 0144   LABBARB NONE DETECTED 09/18/2015 0144      IMAGING  Ct Head Wo Contrast  09/18/2015  CLINICAL DATA:  Right-sided numbness. EXAM: CT HEAD WITHOUT CONTRAST TECHNIQUE: Contiguous axial images were obtained from the base of the skull through the vertex without intravenous contrast. COMPARISON:  01/10/2009 FINDINGS: There is no intracranial hemorrhage, mass or evidence of acute infarction. There is mild generalized atrophy. There is remote lacunar infarction in the left frontal periventricular white matter. Gray-white differentiation is preserved. Posterior fossa is unremarkable. Visible portions of the paranasal sinuses and orbits are unremarkable. IMPRESSION: No acute intracranial findings. 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 These results were called by telephone at the time of interpretation on 09/18/2015 at 1:23 am to Dr. Frederick PeersACHEL LITTLE , who verbally acknowledged these results. Electronically Signed   By: Ellery Plunkaniel R Mitchell  M.D.   On: 09/18/2015 01:23   Mr Brain Wo Contrast  09/18/2015  CLINICAL DATA:  New onset of right-sided numbness and weakness. Symptoms began earlier this evening at 11:45 p.m. Symptoms have been improving since onset and are now nearly resolved. Acute ischemic stroke. Initial presentation. EXAM: MRI HEAD WITHOUT CONTRAST MRA HEAD WITHOUT CONTRAST TECHNIQUE: Multiplanar, multiecho pulse sequences of the brain and surrounding structures were obtained without intravenous contrast. Angiographic images of the head were obtained using MRA technique without contrast.  COMPARISON:  CT head without contrast from the same day. FINDINGS: MRI HEAD FINDINGS An 8 mm focus of restricted diffusion is present within the lateral aspect of the left thalamus compatible with an acute nonhemorrhagic infarct. No other foci of restricted diffusion are present. There is faint T2 hyperintensity associated with the area of restricted diffusion. Moderate periventricular and subcortical white matter disease is present bilaterally. A remote cortical infarct is present in the right occipital lobe. The ventricles are proportionate to the degree of atrophy. Dilated perivascular spaces are evident within the basal ganglia. The internal auditory canals are within normal limits bilaterally. The brainstem and cerebellum are normal. Flow is present in the major intracranial arteries. The globes and orbits are intact. The paranasal sinuses are clear. There is some fluid in the left mastoid air cells. No obstructing nasopharyngeal lesion is evident. The skullbase is within normal limits. Midline sagittal images are unremarkable. 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 The internal carotid arteries are otherwise normal bilaterally. The A1 and M1 segments are within normal limits. There is moderate narrowing of the inferior posterior left M2 branch on the left and the superior anterior M2 branch on the right. There is some attenuation of distal MCA and ACA branch vessels. Moderate A2 segment stenoses are present bilaterally. The left vertebral artery is slightly dominant to the right. The left PICA origin is visualized and normal. The basilar artery is within normal limits. There is significant signal loss within the proximal posterior cerebral arteries bilaterally. Asymmetric attenuation of right PCA branch vessels is noted. IMPRESSION: 1. Acute nonhemorrhagic 8 mm infarct within the lateral aspect of the left thalamus. 2. Moderate  periventricular and subcortical T2 changes bilaterally reflect the sequela of chronic microvascular ischemia. 3. Moderate proximal M2, A2, and P2 segment stenoses as described above. 4. No significant more proximal stenoses. 5. 1 mm infundibulum of the right posterior communicating artery. These results were called by telephone at the time of interpretation on 09/18/2015 at 9:09 am to Dr. Dartha Lodge, who verbally acknowledged these results. Electronically Signed   By: Marin Roberts M.D.   On: 09/18/2015 09:17   Mr Maxine Glenn Head/brain Wo Cm  09/18/2015  CLINICAL DATA:  New onset of right-sided numbness and weakness. Symptoms began earlier this evening at 11:45 p.m. Symptoms have been improving since onset and are now nearly resolved. Acute ischemic stroke. Initial presentation. EXAM: MRI HEAD WITHOUT CONTRAST MRA HEAD WITHOUT CONTRAST TECHNIQUE: Multiplanar, multiecho pulse sequences of the brain and surrounding structures were obtained without intravenous contrast. Angiographic images of the head were obtained using MRA technique without contrast. COMPARISON:  CT head without contrast from the same day. FINDINGS: MRI HEAD FINDINGS An 8 mm focus of restricted diffusion is present within the lateral aspect of the left thalamus compatible with an acute nonhemorrhagic infarct. No other foci of restricted diffusion are present. There is faint T2 hyperintensity associated with the area of  restricted diffusion. Moderate periventricular and subcortical white matter disease is present bilaterally. A remote cortical infarct is present in the right occipital lobe. The ventricles are proportionate to the degree of atrophy. Dilated perivascular spaces are evident within the basal ganglia. The internal auditory canals are within normal limits bilaterally. The brainstem and cerebellum are normal. Flow is present in the major intracranial arteries. The globes and orbits are intact. The paranasal sinuses are clear. There is some fluid  in the left mastoid air cells. No obstructing nasopharyngeal lesion is evident. The skullbase is within normal limits. Midline sagittal images are unremarkable. 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 The internal carotid arteries are otherwise normal bilaterally. The A1 and M1 segments are within normal limits. There is moderate narrowing of the inferior posterior left M2 branch on the left and the superior anterior M2 branch on the right. There is some attenuation of distal MCA and ACA branch vessels. Moderate A2 segment stenoses are present bilaterally. The left vertebral artery is slightly dominant to the right. The left PICA origin is visualized and normal. The basilar artery is within normal limits. There is significant signal loss within the proximal posterior cerebral arteries bilaterally. Asymmetric attenuation of right PCA branch vessels is noted. IMPRESSION: 1. Acute nonhemorrhagic 8 mm infarct within the lateral aspect of the left thalamus. 2. Moderate periventricular and subcortical T2 changes bilaterally reflect the sequela of chronic microvascular ischemia. 3. Moderate proximal M2, A2, and P2 segment stenoses as described above. 4. No significant more proximal stenoses. 5. 1 mm infundibulum of the right posterior communicating artery. These results were called by telephone at the time of interpretation on 09/18/2015 at 9:09 am to Dr. Dartha Lodge, who verbally acknowledged these results. Electronically Signed   By: Marin Roberts M.D.   On: 09/18/2015 09:17     PHYSICAL EXAM Pleasant elderly gentleman currently not in distress. . Afebrile. Head is nontraumatic. Neck is supple without bruit.    Cardiac exam no murmur or gallop. Lungs are clear to auscultation. Distal pulses are well felt. Neurological Exam ;  Awake  Alert oriented x 3. Normal speech and language.eye movements full without nystagmus.fundi were not  visualized. Vision acuity and fields appear normal. Hearing is normal. Palatal movements are normal. Face symmetric. Tongue midline. Normal strength, tone, reflexes and coordination. Normal sensation. Gait deferred. ASSESSMENT/PLAN Mr. Nickholas Goldston is a 75 y.o. male with history of HTN and DB presenting with right side numbness. He did not receive IV t-PA due to mild deficits, pt decision.   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 R PCA infundibulum  Carotid Doppler  There is no obvious evidence of hemodynamically significant internal carotid artery stenosis bilaterally. Vertebral arteries are patent with antegrade flow 2D Echo  Left ventricle: The cavity size was normal. Systolic function was  normal. The estimated ejection fraction was in the range of 60%  to 65%. Wall motion was normal; there were no regional wall   motion abnormalities.  LDL 130  HgbA1c 8.0  Lovenox 40 mg sq daily for VTE prophylaxis Diet Carb Modified Fluid consistency:: Thin; Room service appropriate?: Yes Diet - low sodium heart healthy  aspirin 81 mg daily prior to admission, now on aspirin 325 mg daily. Given stroke on aspirin, will change to plavix.   Patient counseled to be compliant with his  antithrombotic medications  Ongoing aggressive stroke risk factor management  healthy diet and exercise advised. Avoid soda  Therapy recommendations: home health Disposition:  home Hypertensive Emergency  BP as high as 172/109  Improved   Hyperlipidemia  Home meds:  130  LDL 130, goal < 70  Add statin - lipitor 40  Diabetes  HgbA1c pending , goal < 7.0  Other Stroke Risk Factors  Advanced age  Family hx stroke (father)  Hospital day # 1     I have personally examined this patient, reviewed notes, independently viewed imaging studies, participated in medical decision making and plan of care. I have  made any additions or clarifications directly to the above note.  Greater than 50% of time during this 15 minute visit was spent on counseling and coordination of care.Follow-up as an outpatient in stroke clinic in 2 months. Stroke team will sign off Kindly call for questions if any. Delia Heady, MD Medical Director [facility] Pager: [phone] 09/19/2015 4:40 PM   To contact Stroke Continuity provider, please refer to [email or web address]. After hours, contact General Neurology

## 2015-09-19 NOTE — Discharge Summary (Signed)
Physician Discharge Summary  Patient ID: Kenneth GosselinDarrell Arnold MRN: [id number] DOB/AGE: 1940/06/28 75 y.o.  Admit date: 09/18/2015 Discharge date: 09/19/2015  Admission Diagnoses:  Discharge Diagnoses:  Principal Problem:   Ataxia Active Problems:   HTN (hypertension)   DM2 (diabetes mellitus, type 2) (HCC)   Discharged Condition: stable  Hospital Course: 75 y.o. male with medical history significant of DM2, HTN. Patient presents to the ED with c/o sudden onset of R sided numbness and weakness. MRI of the brain reveals 8 millimeter left thalamic infarct. Patient was admitted for further assessment and management. Patient's aspirin was held on admission, and plavix started. Patient's care was directed by the "Stroke Team". Patient has improved significantly and will be discharged back home today with Home Health PT/OT.  Consults: Neurology  Significant Diagnostic Studies: Imaging studies  Discharge Medication - See Med. Rec.  Discharge Exam: Blood pressure 149/91, pulse 75, temperature 98.9 F (37.2 C), temperature source Oral, resp. rate 17, height 5\' 9"  (1.753 m), weight 71.5 kg (157 lb 10.1 oz), SpO2 98 %.    Disposition: 01-Home or Self Care  Discharge Instructions    Call MD for:    Complete by:  As directed   Worsening symptoms     Diet - low sodium heart healthy    Complete by:  As directed      Discharge instructions    Complete by:  As directed   Call MD with worsening symptoms     Increase activity slowly    Complete by:  As directed             Medication List    STOP taking these medications        aspirin EC 81 MG tablet      TAKE these medications        acetaminophen 500 MG tablet  Commonly known as:  TYLENOL  Take 1,000 mg by mouth every 6 (six) hours as needed for headache.     atorvastatin 40 MG tablet  Commonly known as:  LIPITOR  Take 1 tablet (40 mg total) by mouth daily at 6 PM.     clopidogrel 75 MG tablet  Commonly known as:  PLAVIX   Take 1 tablet (75 mg total) by mouth daily.     esomeprazole 40 MG capsule  Commonly known as:  NEXIUM  Take 40 mg by mouth daily at 12 noon.     glimepiride 4 MG tablet  Commonly known as:  AMARYL  Take 4 mg by mouth daily with breakfast.     lisinopril 10 MG tablet  Commonly known as:  PRINIVIL,ZESTRIL  Take 10 mg by mouth daily.         SignedBarnetta Chapel: Xavior Niazi I Ayaan Ringle 09/19/2015, 2:07 PM

## 2015-10-01 ENCOUNTER — Other Ambulatory Visit: Payer: Self-pay

## 2015-10-02 NOTE — Patient Outreach (Signed)
[facility] Care Management  10/02/2015  Catha GosselinDarrell Rempel 1940-09-30 [id number]  Late entry for  10/01/15 Telephone call to patient.  HIPAA verified with patient. [facility]  Patient did not verbalize reason for admission.  PLAN:  RNCM referred patient to Nena PolioLisa Moore to close due to patient being readmitted to hospital.   George Inaavina Aahan Marques RN,BSN,[facility] Telephonic  [phone]
